# Patient Record
Sex: Female | Born: 1955 | Race: White | Hispanic: No
Health system: Southern US, Community
[De-identification: ages and names within clinical notes are randomized; demographics above are authoritative.]

---

## 2017-09-04 ENCOUNTER — Other Ambulatory Visit: Payer: Self-pay

## 2017-09-04 ENCOUNTER — Emergency Department (HOSPITAL_COMMUNITY): Payer: Self-pay

## 2017-09-04 ENCOUNTER — Inpatient Hospital Stay (HOSPITAL_COMMUNITY)
Admission: EM | Admit: 2017-09-04 | Discharge: 2017-09-26 | DRG: 917 | Disposition: E | Payer: Self-pay | Attending: Pulmonary Disease | Admitting: Pulmonary Disease

## 2017-09-04 ENCOUNTER — Inpatient Hospital Stay (HOSPITAL_COMMUNITY): Payer: Self-pay

## 2017-09-04 DIAGNOSIS — J9601 Acute respiratory failure with hypoxia: Secondary | ICD-10-CM | POA: Diagnosis present

## 2017-09-04 DIAGNOSIS — E872 Acidosis: Secondary | ICD-10-CM | POA: Diagnosis present

## 2017-09-04 DIAGNOSIS — R402312 Coma scale, best motor response, none, at arrival to emergency department: Secondary | ICD-10-CM | POA: Diagnosis present

## 2017-09-04 DIAGNOSIS — J9602 Acute respiratory failure with hypercapnia: Secondary | ICD-10-CM | POA: Diagnosis present

## 2017-09-04 DIAGNOSIS — R402212 Coma scale, best verbal response, none, at arrival to emergency department: Secondary | ICD-10-CM | POA: Diagnosis present

## 2017-09-04 DIAGNOSIS — F329 Major depressive disorder, single episode, unspecified: Secondary | ICD-10-CM | POA: Diagnosis present

## 2017-09-04 DIAGNOSIS — G92 Toxic encephalopathy: Secondary | ICD-10-CM | POA: Diagnosis present

## 2017-09-04 DIAGNOSIS — I959 Hypotension, unspecified: Secondary | ICD-10-CM | POA: Diagnosis present

## 2017-09-04 DIAGNOSIS — I469 Cardiac arrest, cause unspecified: Secondary | ICD-10-CM | POA: Diagnosis present

## 2017-09-04 DIAGNOSIS — Z66 Do not resuscitate: Secondary | ICD-10-CM | POA: Diagnosis not present

## 2017-09-04 DIAGNOSIS — G253 Myoclonus: Secondary | ICD-10-CM

## 2017-09-04 DIAGNOSIS — F1120 Opioid dependence, uncomplicated: Secondary | ICD-10-CM | POA: Diagnosis present

## 2017-09-04 DIAGNOSIS — G931 Anoxic brain damage, not elsewhere classified: Secondary | ICD-10-CM

## 2017-09-04 DIAGNOSIS — T447X1A Poisoning by beta-adrenoreceptor antagonists, accidental (unintentional), initial encounter: Secondary | ICD-10-CM | POA: Diagnosis present

## 2017-09-04 DIAGNOSIS — E875 Hyperkalemia: Secondary | ICD-10-CM | POA: Diagnosis present

## 2017-09-04 DIAGNOSIS — Z515 Encounter for palliative care: Secondary | ICD-10-CM | POA: Diagnosis not present

## 2017-09-04 DIAGNOSIS — R680 Hypothermia, not associated with low environmental temperature: Secondary | ICD-10-CM | POA: Diagnosis present

## 2017-09-04 DIAGNOSIS — I468 Cardiac arrest due to other underlying condition: Secondary | ICD-10-CM | POA: Diagnosis present

## 2017-09-04 DIAGNOSIS — N179 Acute kidney failure, unspecified: Secondary | ICD-10-CM | POA: Diagnosis present

## 2017-09-04 DIAGNOSIS — R739 Hyperglycemia, unspecified: Secondary | ICD-10-CM | POA: Diagnosis present

## 2017-09-04 DIAGNOSIS — T43011A Poisoning by tricyclic antidepressants, accidental (unintentional), initial encounter: Principal | ICD-10-CM | POA: Diagnosis present

## 2017-09-04 DIAGNOSIS — R402112 Coma scale, eyes open, never, at arrival to emergency department: Secondary | ICD-10-CM | POA: Diagnosis present

## 2017-09-04 DIAGNOSIS — Z7951 Long term (current) use of inhaled steroids: Secondary | ICD-10-CM

## 2017-09-04 DIAGNOSIS — J690 Pneumonitis due to inhalation of food and vomit: Secondary | ICD-10-CM | POA: Diagnosis present

## 2017-09-04 LAB — GLUCOSE, CAPILLARY
GLUCOSE-CAPILLARY: 130 mg/dL — AB (ref 65–99)
Glucose-Capillary: 124 mg/dL — ABNORMAL HIGH (ref 65–99)

## 2017-09-04 LAB — I-STAT ARTERIAL BLOOD GAS, ED
ACID-BASE DEFICIT: 12 mmol/L — AB (ref 0.0–2.0)
Acid-base deficit: 16 mmol/L — ABNORMAL HIGH (ref 0.0–2.0)
Bicarbonate: 15.2 mmol/L — ABNORMAL LOW (ref 20.0–28.0)
Bicarbonate: 17.9 mmol/L — ABNORMAL LOW (ref 20.0–28.0)
O2 SAT: 96 %
O2 Saturation: 98 %
PCO2 ART: 60.9 mmHg — AB (ref 32.0–48.0)
PCO2 ART: 54.6 mmHg — AB (ref 32.0–48.0)
PH ART: 7.006 — AB (ref 7.350–7.450)
PH ART: 7.125 — AB (ref 7.350–7.450)
PO2 ART: 171 mmHg — AB (ref 83.0–108.0)
Patient temperature: 98.6
Patient temperature: 98.6
TCO2: 17 mmol/L — AB (ref 22–32)
TCO2: 20 mmol/L — ABNORMAL LOW (ref 22–32)
pO2, Arterial: 110 mmHg — ABNORMAL HIGH (ref 83.0–108.0)

## 2017-09-04 LAB — PROTIME-INR
INR: 1.28
PROTHROMBIN TIME: 15.9 s — AB (ref 11.4–15.2)

## 2017-09-04 LAB — CBC WITH DIFFERENTIAL/PLATELET
BASOS PCT: 0 %
Band Neutrophils: 0 %
Basophils Absolute: 0.2 10*3/uL — ABNORMAL HIGH (ref 0.0–0.1)
Blasts: 0 %
EOS ABS: 0.2 10*3/uL (ref 0.0–0.7)
Eosinophils Relative: 2 %
HCT: 41.5 % (ref 36.0–46.0)
HEMOGLOBIN: 12.9 g/dL (ref 12.0–15.0)
LYMPHS PCT: 30 %
Lymphs Abs: 8.9 10*3/uL — ABNORMAL HIGH (ref 0.7–4.0)
MCH: 30.6 pg (ref 26.0–34.0)
MCHC: 31.1 g/dL (ref 30.0–36.0)
MCV: 98.6 fL (ref 78.0–100.0)
MONO ABS: 1.9 10*3/uL — AB (ref 0.1–1.0)
MONOS PCT: 7 %
MYELOCYTES: 0 %
Metamyelocytes Relative: 0 %
NEUTROS PCT: 61 %
NRBC: 0 /100{WBCs}
Neutro Abs: 22 10*3/uL — ABNORMAL HIGH (ref 1.7–7.7)
OTHER: 0 %
PLATELETS: 202 10*3/uL (ref 150–400)
PROMYELOCYTES ABS: 0 %
RBC: 4.21 MIL/uL (ref 3.87–5.11)
RDW: 14.7 % (ref 11.5–15.5)
WBC: 33.1 10*3/uL — AB (ref 4.0–10.5)

## 2017-09-04 LAB — BASIC METABOLIC PANEL
ANION GAP: 8 (ref 5–15)
BUN: 23 mg/dL — ABNORMAL HIGH (ref 6–20)
CALCIUM: 7.3 mg/dL — AB (ref 8.9–10.3)
CO2: 19 mmol/L — ABNORMAL LOW (ref 22–32)
Chloride: 114 mmol/L — ABNORMAL HIGH (ref 101–111)
Creatinine, Ser: 1.28 mg/dL — ABNORMAL HIGH (ref 0.44–1.00)
GFR calc non Af Amer: 45 mL/min — ABNORMAL LOW (ref >60)
GFR, EST AFRICAN AMERICAN: 52 mL/min — AB (ref >60)
GLUCOSE: 149 mg/dL — AB (ref 65–99)
Potassium: 5 mmol/L (ref 3.5–5.1)
SODIUM: 141 mmol/L (ref 135–145)

## 2017-09-04 LAB — COMPREHENSIVE METABOLIC PANEL
ALK PHOS: 124 U/L (ref 38–126)
ALT: 49 U/L (ref 14–54)
AST: 71 U/L — AB (ref 15–41)
Albumin: 2.7 g/dL — ABNORMAL LOW (ref 3.5–5.0)
Anion gap: 17 — ABNORMAL HIGH (ref 5–15)
BUN: 24 mg/dL — AB (ref 6–20)
CALCIUM: 8.7 mg/dL — AB (ref 8.9–10.3)
CHLORIDE: 108 mmol/L (ref 101–111)
CO2: 13 mmol/L — AB (ref 22–32)
CREATININE: 1.34 mg/dL — AB (ref 0.44–1.00)
GFR calc Af Amer: 49 mL/min — ABNORMAL LOW (ref >60)
GFR calc non Af Amer: 42 mL/min — ABNORMAL LOW (ref >60)
GLUCOSE: 235 mg/dL — AB (ref 65–99)
Potassium: 5.7 mmol/L — ABNORMAL HIGH (ref 3.5–5.1)
SODIUM: 138 mmol/L (ref 135–145)
Total Bilirubin: 0.8 mg/dL (ref 0.3–1.2)
Total Protein: 5.3 g/dL — ABNORMAL LOW (ref 6.5–8.1)

## 2017-09-04 LAB — LACTIC ACID, PLASMA
LACTIC ACID, VENOUS: 2.6 mmol/L — AB (ref 0.5–1.9)
LACTIC ACID, VENOUS: 3.1 mmol/L — AB (ref 0.5–1.9)

## 2017-09-04 LAB — INFLUENZA PANEL BY PCR (TYPE A & B)
INFLBPCR: NEGATIVE
Influenza A By PCR: NEGATIVE

## 2017-09-04 LAB — RAPID URINE DRUG SCREEN, HOSP PERFORMED
Amphetamines: NOT DETECTED
BARBITURATES: NOT DETECTED
Benzodiazepines: POSITIVE — AB
Cocaine: POSITIVE — AB
OPIATES: POSITIVE — AB
TETRAHYDROCANNABINOL: NOT DETECTED

## 2017-09-04 LAB — I-STAT TROPONIN, ED: Troponin i, poc: 0.05 ng/mL (ref 0.00–0.08)

## 2017-09-04 LAB — I-STAT CHEM 8, ED
BUN: 29 mg/dL — AB (ref 6–20)
CALCIUM ION: 1.09 mmol/L — AB (ref 1.15–1.40)
CREATININE: 1.2 mg/dL — AB (ref 0.44–1.00)
Chloride: 112 mmol/L — ABNORMAL HIGH (ref 101–111)
GLUCOSE: 238 mg/dL — AB (ref 65–99)
HCT: 43 % (ref 36.0–46.0)
HEMOGLOBIN: 14.6 g/dL (ref 12.0–15.0)
Potassium: 5.2 mmol/L — ABNORMAL HIGH (ref 3.5–5.1)
Sodium: 138 mmol/L (ref 135–145)
TCO2: 16 mmol/L — AB (ref 22–32)

## 2017-09-04 LAB — I-STAT CG4 LACTIC ACID, ED: Lactic Acid, Venous: 9.76 mmol/L (ref 0.5–1.9)

## 2017-09-04 LAB — PHOSPHORUS: Phosphorus: 6.9 mg/dL — ABNORMAL HIGH (ref 2.5–4.6)

## 2017-09-04 LAB — SALICYLATE LEVEL

## 2017-09-04 LAB — ACETAMINOPHEN LEVEL

## 2017-09-04 LAB — PROCALCITONIN: Procalcitonin: <0.1 ng/mL

## 2017-09-04 LAB — ETHANOL: Alcohol, Ethyl (B): <10 mg/dL (ref <10)

## 2017-09-04 LAB — TROPONIN I: TROPONIN I: 0.14 ng/mL — AB (ref <0.03)

## 2017-09-04 LAB — MAGNESIUM: Magnesium: 1.8 mg/dL (ref 1.7–2.4)

## 2017-09-04 LAB — CBG MONITORING, ED: GLUCOSE-CAPILLARY: 174 mg/dL — AB (ref 65–99)

## 2017-09-04 MED ORDER — INSULIN ASPART 100 UNIT/ML ~~LOC~~ SOLN
1.0000 [IU] | SUBCUTANEOUS | Status: DC
  Administered 2017-09-04 – 2017-09-07 (×12): 1 [IU] via SUBCUTANEOUS

## 2017-09-04 MED ORDER — ALBUTEROL SULFATE (2.5 MG/3ML) 0.083% IN NEBU: 2.5000 mg | INHALATION_SOLUTION | Freq: Four times a day (QID) | RESPIRATORY_TRACT | Status: DC | PRN

## 2017-09-04 MED ORDER — PIPERACILLIN-TAZOBACTAM 3.375 G IVPB 30 MIN: 3.3750 g | Freq: Once | INTRAVENOUS | Status: DC

## 2017-09-04 MED ORDER — PHENYLEPHRINE HCL 10 MG/ML IJ SOLN
0.0000 ug/min | Freq: Once | INTRAVENOUS | Status: DC
  Filled 2017-09-04: qty 1

## 2017-09-04 MED ORDER — SODIUM CHLORIDE 0.9 % IV SOLN: 250.0000 mL | INTRAVENOUS | Status: DC | PRN

## 2017-09-04 MED ORDER — FENTANYL CITRATE (PF) 100 MCG/2ML IJ SOLN
50.0000 ug | Freq: Once | INTRAMUSCULAR | Status: DC
  Filled 2017-09-04: qty 2

## 2017-09-04 MED ORDER — SODIUM BICARBONATE 8.4 % IV SOLN
50.0000 meq | Freq: Once | INTRAVENOUS | Status: AC
  Administered 2017-09-04: 50 meq via INTRAVENOUS

## 2017-09-04 MED ORDER — VANCOMYCIN HCL IN DEXTROSE 750-5 MG/150ML-% IV SOLN
750.0000 mg | Freq: Two times a day (BID) | INTRAVENOUS | Status: DC
  Administered 2017-09-05: 750 mg via INTRAVENOUS
  Filled 2017-09-04: qty 150

## 2017-09-04 MED ORDER — PANTOPRAZOLE SODIUM 40 MG IV SOLR
40.0000 mg | Freq: Two times a day (BID) | INTRAVENOUS | Status: DC
  Administered 2017-09-04 – 2017-09-08 (×8): 40 mg via INTRAVENOUS
  Filled 2017-09-04 (×8): qty 40

## 2017-09-04 MED ORDER — VANCOMYCIN HCL 10 G IV SOLR
2000.0000 mg | Freq: Once | INTRAVENOUS | Status: AC
  Administered 2017-09-04: 2000 mg via INTRAVENOUS
  Filled 2017-09-04: qty 2000

## 2017-09-04 MED ORDER — MIDAZOLAM HCL 2 MG/2ML IJ SOLN
2.0000 mg | Freq: Once | INTRAMUSCULAR | Status: AC
  Administered 2017-09-04: 2 mg via INTRAVENOUS
  Filled 2017-09-04: qty 2

## 2017-09-04 MED ORDER — SODIUM CHLORIDE 0.9 % IV BOLUS (SEPSIS)
1000.0000 mL | Freq: Once | INTRAVENOUS | Status: AC
  Administered 2017-09-04: 1000 mL via INTRAVENOUS

## 2017-09-04 MED ORDER — SODIUM BICARBONATE 8.4 % IV SOLN
INTRAVENOUS | Status: DC
  Administered 2017-09-04: 21:00:00 via INTRAVENOUS
  Filled 2017-09-04 (×2): qty 850

## 2017-09-04 MED ORDER — HEPARIN SODIUM (PORCINE) 5000 UNIT/ML IJ SOLN
5000.0000 [IU] | Freq: Three times a day (TID) | INTRAMUSCULAR | Status: DC
  Administered 2017-09-04 – 2017-09-08 (×11): 5000 [IU] via SUBCUTANEOUS
  Filled 2017-09-04 (×12): qty 1

## 2017-09-04 MED ORDER — PIPERACILLIN-TAZOBACTAM 3.375 G IVPB
3.3750 g | Freq: Three times a day (TID) | INTRAVENOUS | Status: DC
  Administered 2017-09-05: 3.375 g via INTRAVENOUS
  Filled 2017-09-04 (×2): qty 50

## 2017-09-04 MED ORDER — IPRATROPIUM-ALBUTEROL 0.5-2.5 (3) MG/3ML IN SOLN
3.0000 mL | RESPIRATORY_TRACT | Status: DC
  Administered 2017-09-04 – 2017-09-08 (×23): 3 mL via RESPIRATORY_TRACT
  Filled 2017-09-04 (×22): qty 3

## 2017-09-04 NOTE — Progress Notes (Signed)
   2018-07-09 1500  Clinical Encounter Type  Visited With Health care provider  Visit Type ED  Referral From Nurse  Consult/Referral To Chaplain   Responded to a Code Stemi for this patient.  CPR was in place.  Patient is from out of state visiting a friend, according to EMS.  Unsure if friend will come.  No next of kin contact at this point.  Medical team assessing the patient.  Will follow as needed. Chaplain Agustin CreeNewton Jerome Viglione

## 2017-09-04 NOTE — Progress Notes (Signed)
Critical ABG values reported to Dr. Ardeth PerfectJeong.

## 2017-09-04 NOTE — ED Triage Notes (Addendum)
Pt arrives by [facility] for post cardiac arrest, per report she was found to be less responsive around 1315 and ems contacted family provided breaths for patient with no cpr until EMS arrival at 1345 where pt was found to be in asystole, 1 mg narcan 4 mg epi, no shocks and pt re gained pulses PTA at 1413.  Pt arrives to Trauma C met by EPD and cardiologist with airway in place. Heart rate 130. bp 130/73

## 2017-09-04 NOTE — Progress Notes (Signed)
Critical ABG values reported to Dr. Jeong.  

## 2017-09-04 NOTE — ED Notes (Signed)
X RAY at bedside 

## 2017-09-04 NOTE — ED Provider Notes (Signed)
  Physical Exam  BP (!) 79/66   Pulse 70   Resp 18   Ht 5\' 5"  (1.651 m)   SpO2 100%   Physical Exam  ED Course/Procedures     Procedures  MDM   PT comes in post cardiac arrest. Pt is from outside town, and residing with a friend. Pt was doing well when she arrived to the friend's home. The friend noted that pt was noted to have dib and turned blue and then became unresponsive. Pt was noted to be in CPR by EMS, and after several rounds of CPR and epi we had ROSC. Pt didn't have Vfib.  BP is low, but MAP > 65. Labs pending. Cards doesn't think this is MI - no need for cath.  Pt had received epi and narcan prior to ER arrival. There is hx of opiate abuse, but bystanders reported that pt was not using any drugs as of late.  CCM to admit.      Derwood KaplanNanavati, Senan Urey, MD 09/24/2017 1540

## 2017-09-04 NOTE — ED Provider Notes (Signed)
[facility] EMERGENCY DEPARTMENT Provider Note   CSN: [id number] Arrival date & time: 09/09/17  1440     History   Chief Complaint Chief Complaint  Patient presents with  . Cardiac Arrest    HPI Ashleen Demma is a 62 y.o. female.  Patient is a 62 year old female with little known medical history as she is here from out of town staying with some friends who reported that she was normal this morning and around 1:00 this afternoon they were helping her move from a couch to the bedroom when she looked like she was having some difficulty breathing.  Shortly afterwards she became blue and stopped breathing.  They gave mouth-to-mouth and when fire arrived CPR was initiated.  When paramedics arrived patient was in asystole and then went into PEA.  She required 30 minutes of CPR, 4 rounds of epi and a dose of Narcan with return of spontaneous circulation.  Per witnesses patient had a prior history of opiate addiction but was not known to be using currently.  Patient does take benzos but EMS stated there was an appropriate amount in the bottle.  Patient does take gabapentin but unknown if there is any cardiac history.  Unknown if patient is a smoker   The history is provided by the EMS personnel. The history is limited by the condition of the patient.  Cardiac Arrest  Witnessed by:  Friend Incident location:  Home Time before BLS initiated:  3-5 minutes Time before ALS initiated:  8-10 minutes Condition upon EMS arrival:  Agonal respirations Pulse:  Absent Initial cardiac rhythm per EMS:  Asystole Treatments prior to arrival:  ACLS protocol Medications given prior to ED:  Epinephrine Airway:  Bag valve mask and intubation prior to arrival Rhythm on admission to ED:  Sinus tachycardia   No past medical history on file.  There are no active problems to display for this patient.     OB History    No data available       Home Medications    Prior to Admission  medications   Not on File    Family History No family history on file.  Social History Social History   Tobacco Use  . Smoking status: Not on file  Substance Use Topics  . Alcohol use: Not on file  . Drug use: Not on file     Allergies   Patient has no allergy information on record.   Review of Systems Review of Systems  Unable to perform ROS: Acuity of condition     Physical Exam Updated Vital Signs BP 91/67   Pulse 73   Resp 15   Ht 5\' 5"  (1.651 m)   SpO2 100%   Physical Exam  Constitutional: She appears well-developed and well-nourished.  Patient is ill-appearing, intubated and unresponsive  HENT:  Head: Normocephalic and atraumatic.  Mouth/Throat: Oropharynx is clear and moist.  Eyes: Conjunctivae and EOM are normal. Pupils are equal, round, and reactive to light.  Neck: Normal range of motion. Neck supple.  Cardiovascular: Regular rhythm. Tachycardia present.  No murmur heard. Femoral pulses are palpated  Pulmonary/Chest: She has no wheezes. She has rhonchi. She has no rales.  Intubated with a 7.0 tube breath sounds are equal bilaterally but rhonchorous diffusely  Abdominal: Soft. She exhibits no distension. There is no tenderness. There is no rebound and no guarding.  Musculoskeletal: Normal range of motion. She exhibits no edema or tenderness.  Neurological:  GCS of 3.  No  purposeful movements.  Pupils are 4 mm and minimally reactive  Skin: Skin is warm and dry. Capillary refill takes more than 3 seconds. No rash noted. No erythema.  Dusky toes and fingers  Nursing note and vitals reviewed.    ED Treatments / Results  Labs (all labs ordered are listed, but only abnormal results are displayed) Labs Reviewed  I-STAT CHEM 8, ED - Abnormal; Notable for the following components:      Result Value   Potassium 5.2 (*)    Chloride 112 (*)    BUN 29 (*)    Creatinine, Ser 1.20 (*)    Glucose, Bld 238 (*)    Calcium, Ion 1.09 (*)    TCO2 16 (*)     All other components within normal limits  I-STAT CG4 LACTIC ACID, ED - Abnormal; Notable for the following components:   Lactic Acid, Venous 9.76 (*)    All other components within normal limits  CBC WITH DIFFERENTIAL/PLATELET  COMPREHENSIVE METABOLIC PANEL  RAPID URINE DRUG SCREEN, HOSP PERFORMED  ETHANOL  ACETAMINOPHEN LEVEL  SALICYLATE LEVEL  I-STAT TROPONIN, ED  I-STAT ARTERIAL BLOOD GAS, ED    EKG ED ECG REPORT   Date: 09/25/2017  Rate: 69  Rhythm: normal sinus rhythm and premature ventricular contractions (PVC)  QRS Axis: normal  Intervals: PR prolonged  ST/T Wave abnormalities: nonspecific ST/T changes  Conduction Disutrbances:nonspecific intraventricular conduction delay  Narrative Interpretation:   Old EKG Reviewed: none available  I have personally reviewed the EKG tracing and agree with the computerized printout as noted.   Radiology Dg Chest Port 1 View  Result Date: 09/12/2017 CLINICAL DATA:  Status post cardiac arrest.  Hypoxia. EXAM: PORTABLE CHEST 1 VIEW COMPARISON:  None. FINDINGS: Endotracheal tube tip is 5.0 cm above the carina. Nasogastric tube tip and side port are below the diaphragm. No pneumothorax. There is apparent fibrosis throughout the lungs bilaterally. There may be superimposed interstitial edema. There is no consolidation. The heart size is normal. Pulmonary vascularity appears grossly normal on this supine portable chest examination. No adenopathy appreciable. No bone lesions. IMPRESSION: Tube positions as described without evident pneumothorax. Probable pulmonary fibrosis with questionable superimposed interstitial edema. No consolidation. Heart size normal. Electronically Signed   By: Bretta Bang III M.D.   On: 09/22/2017 15:10    Procedures Procedures (including critical care time)  Medications Ordered in ED Medications  sodium chloride 0.9 % bolus 1,000 mL (1,000 mLs Intravenous New Bag/Given 09/05/2017 1453)  fentaNYL (SUBLIMAZE)  injection 50 mcg (not administered)  midazolam (VERSED) injection 2 mg (not administered)     Initial Impression / Assessment and Plan / ED Course  I have reviewed the triage vital signs and the nursing notes.  Pertinent labs & imaging results that were available during my care of the patient were reviewed by me and considered in my medical decision making (see chart for details).     Patient presenting as a postarrest however concern for possible respiratory arrest.  Unclear if patient is currently abusing drugs but appeared to be a witnessed arrest.  Patient received 30 minutes of CPR, 4 rounds of epi and Narcan.  Patient is showing minimal signs of life.  She is breathing over the tube but is not reacting to pain and has not had sedation.  Initially this was called a code STEMI however upon arrival here patient does not have classic ST elevation concerning for MI.  Concern for possible overdose versus PE.  Lower suspicion for dissection.  Also concern for cardiac cause.  Chest x-ray, CBC, CMP, troponin, ABG, lactate, UDS, EtOH, acetaminophen, salicylate is pending.  Patient is maintaining blood pressures in the high 80s and low 90s with maps in the 70s.  We will continue fluid bolus at this time.  Patient's chest x-ray shows appropriate tube placement with some possible mild overlying edema but no evidence of pneumothorax or pneumonia.  Lactate is 9.7 with a potassium of 5.2 and a creatinine of 1.2.  Troponin is within normal limits.  Rest of labs are still pending.  Discussed with critical care who will come and consult on the patient.  Vision is not currently a candidate for cooling as she had a prolonged downtime initial rhythm of asystole and does not meet criteria.  She currently has received no sedation and has minimal signs of life  CRITICAL CARE Performed by: Chas Axel Total critical care time: 30 minutes Critical care time was exclusive of separately billable procedures and  treating other patients. Critical care was necessary to treat or prevent imminent or life-threatening deterioration. Critical care was time spent personally by me on the following activities: development of treatment plan with patient and/or surrogate as well as nursing, discussions with consultants, evaluation of patient's response to treatment, examination of patient, obtaining history from patient or surrogate, ordering and performing treatments and interventions, ordering and review of laboratory studies, ordering and review of radiographic studies, pulse oximetry and re-evaluation of patient's condition.   Final Clinical Impressions(s) / ED Diagnoses   Final diagnoses:  Cardiac arrest [facility](HCC)    ED Discharge Orders    None       Gwyneth SproutPlunkett, Cathyrn Deas, MD November 15, 2017 1558

## 2017-09-04 NOTE — H&P (Signed)
PULMONARY / CRITICAL CARE MEDICINE   Name: Sheila Hogan MRN: [id number] DOB: [birth year]    ADMISSION DATE:  09/13/2017 CONSULTATION DATE:  09/23/2017  REFERRING MD:  Dr. Kathrynn Humble  CHIEF COMPLAINT:  Cardiac arrest  HISTORY OF PRESENT ILLNESS:   HPI obtained medical chart review as patient is acutely encephalopathic and intubated.   62 year old female with little known prior medical history other than opioid abuse who presented to the ER on 1/10 after cardiac arrest.  Reportedly patient lives in [place] but is here visiting a friend.  No family or friends present at hospital.  Friend over phone reported patient has been depressed recently, and taking Tums for unknown GI issues.  Unknown recent opioid or elicit drug use.  RX bottles present with patient for Neurontin which are accounted for and xanax.    Apparently witnessed by friend today to go unresponsive, turned blue and mouth-to-mouth was giving but no bystander CPR.  EMS found patient in asystole at 1345.  Narcan and 4 rounds of epi with CPR no defibrillation given with ROSC at 1413.  Code STEMI activated EMS but canceled on arrival to ER by EDP and cardiology after review of EKG.   Patient found to be breathing over the vent and unresponsive.  No sedation or paralytics given.  Noted to be hypothermic at 32.5 Celsius, hypotensive currently receiving second liter normal saline bolus, troponin 0.5, sCr 1.34, K 5.7, CO2 13, lactic acid 9.76, other labs pending, UDS pending.  PCCM to admit.  PAST MEDICAL HISTORY :  She  has no past medical history on file.  PAST SURGICAL HISTORY: She  has no past surgical history on file.  No Known Allergies  No current facility-administered medications on file prior to encounter.    Current Outpatient Medications on File Prior to Encounter  Medication Sig  . alprazolam (XANAX) 2 MG tablet Take 2 mg by mouth at bedtime as needed for sleep.  Marland Kitchen amitriptyline (ELAVIL) 10 MG tablet Take 10 mg by mouth at  bedtime.  . calcium carbonate (TUMS - DOSED IN MG ELEMENTAL CALCIUM) 500 MG chewable tablet Chew 1 tablet by mouth every 3 (three) hours as needed for indigestion or heartburn.  . cyclobenzaprine (FLEXERIL) 10 MG tablet Take 10 mg by mouth 3 (three) times daily as needed for muscle spasms.  . Fluticasone-Salmeterol (ADVAIR DISKUS) 500-50 MCG/DOSE AEPB Inhale 1 puff into the lungs 2 (two) times daily.  Marland Kitchen gabapentin (NEURONTIN) 300 MG capsule Take 600 mg by mouth 3 (three) times daily.  Marland Kitchen linaclotide (LINZESS) 145 MCG CAPS capsule Take 145 mcg by mouth every other day.  . pantoprazole (PROTONIX) 40 MG tablet Take 40 mg by mouth daily.  . ranitidine (ZANTAC) 150 MG tablet Take 150 mg by mouth 2 (two) times daily.  . rizatriptan (MAXALT-MLT) 10 MG disintegrating tablet Take 10 mg by mouth once as needed for migraine. May repeat in 2 hours if needed    FAMILY HISTORY:  Her has no family status information on file.    SOCIAL HISTORY: Unknown  REVIEW OF SYSTEMS:   Unable  SUBJECTIVE:   VITAL SIGNS: BP (!) 83/61   Pulse 63   Resp 14   Ht 5' 5"  (1.651 m)   SpO2 100%   HEMODYNAMICS:    VENTILATOR SETTINGS: Vent Mode: PRVC FiO2 (%):  [100 %] 100 % Set Rate:  [14 bmp] 14 bmp Vt Set:  [500 mL] 500 mL PEEP:  [5 cmH20] 5 cmH20 Plateau Pressure:  [16 cmH20-20 cmH20]  20 cmH20  INTAKE / OUTPUT: No intake/output data recorded.  PHYSICAL EXAMINATION: General:  Critically ill appearing healthy adult WF unresponsive on MV HEENT: [place]/NT, pupils 4/minimally reactive, scleral anicteric, absent corneal reflex bilaterally, ETT/OGT, teeth clenched with tongue laceration- scant bleeding, left nasal ring Neuro: unresponsive- no prior sedation or paralytics given CV: RR, weak peripheral pulses, distant heart sounds PULM: even/non-labored on MV, breathing above MV, lungs bilaterally rhonchi with exp wheeze throughout GI: obese, soft, bs hypoactive  Extremities: cool/dry, trace BLE edema, left  tibial IO Skin: no rash, abrasion to left knee, multiple tattoos, no obvious track marks   LABS:  BMET Recent Labs  Lab 09/03/2017 1455 08/27/2017 1522  NA 138 138  K 5.7* 5.2*  CL 108 112*  CO2 13*  --   BUN 24* 29*  CREATININE 1.34* 1.20*  GLUCOSE 235* 238*    Electrolytes Recent Labs  Lab 09/24/2017 1455  CALCIUM 8.7*    CBC Recent Labs  Lab 09/12/2017 1455 08/27/2017 1522  WBC 33.1*  --   HGB 12.9 14.6  HCT 41.5 43.0  PLT 202  --     Coag's No results for input(s): APTT, INR in the last 168 hours.  Sepsis Markers Recent Labs  Lab 08/26/2017 1522  LATICACIDVEN 9.76*    ABG Recent Labs  Lab 08/29/2017 1629  PHART 7.006*  PCO2ART 60.9*  PO2ART 171.0*    Liver Enzymes Recent Labs  Lab 09/03/2017 1455  AST 71*  ALT 49  ALKPHOS 124  BILITOT 0.8  ALBUMIN 2.7*    Cardiac Enzymes No results for input(s): TROPONINI, PROBNP in the last 168 hours.  Glucose No results for input(s): GLUCAP in the last 168 hours.  Imaging Dg Chest Port 1 View  Result Date: 09/24/2017 CLINICAL DATA:  Status post cardiac arrest.  Hypoxia. EXAM: PORTABLE CHEST 1 VIEW COMPARISON:  None. FINDINGS: Endotracheal tube tip is 5.0 cm above the carina. Nasogastric tube tip and side port are below the diaphragm. No pneumothorax. There is apparent fibrosis throughout the lungs bilaterally. There may be superimposed interstitial edema. There is no consolidation. The heart size is normal. Pulmonary vascularity appears grossly normal on this supine portable chest examination. No adenopathy appreciable. No bone lesions. IMPRESSION: Tube positions as described without evident pneumothorax. Probable pulmonary fibrosis with questionable superimposed interstitial edema. No consolidation. Heart size normal. Electronically Signed   By: Lowella Grip III M.D.   On: 08/28/2017 15:10   STUDIES:  1/10 Head CT >>   EEG >> TTE >>  CULTURES: 1/10 BC x 2 >> 1/10 Trach aspirate  >>  ANTIBIOTICS: none  SIGNIFICANT EVENTS: 1/10 Admit  LINES/TUBES: PIV x 2 Left tibial IO 1/10 ETT >> 1/10 OGT >> 1/10 foley >>  DISCUSSION: 82 yoF with little known PMH other than prior opioid abuse. Medication bottles filled in [place], but has [employer]. Practically full xanax 93m bottle filled in 03/2017 with other mixed in pills unknown).  Patient here visiting a friend with witnessed unresponsiveness 1/10, no bystander CPR[place], after 4 rounds epi and Narcan 232m  Hypotensive, hypothermic (no environmental exposure known, ? If witnessed unresponsive why so hypothermic), unresponsive in ER. Unclear etiology of what precipitated  Events.   ASSESSMENT / PLAN:  PULMONARY A: Acute hypoxic/ hypercapnic respiratory failure  R/o aspiration pneumonitis vs pna vs viral  Will need to rule out PE given sudden unresponsiveness/ cardiac arrest/ low Pa/FiO2 ratio  P:   Full MV support,  PRVC Rate adjusted for resp/ met acidosis ABG in one hour Duonebs q4 prn VAP See ID CTA if sCr allows  CARDIOVASCULAR A:  Asystolic Cardiac Arrest- likely precipitated by respiratory arrest given UDS vs acute PE Cocaine positive  Shock - unclear septic +/- cardiogenic Prolonged QTc and wide QRS - known ~28 min down time, found in asystole, no bystander CPR - unknown etiology, suspected related to drug abuse w/hx of opioid abuse - presented hypotensive, hypothermic 32.5 - ruled out as STEMI in ER; Cards did not think MI per EDP notes - troponin 0.05, EKG  Concern for TCA and possible propanolol overdose given wide QRS and hx depression P:  Tele receiving IV NS bolus to make 3L then neo for map > 65 if needed No beta blockers given cocaine use Trend lactate Trend Troponin and EKG now then in AM if no apparent change  Correct electrolyte abnormalities/ acidosis will help stabilize BP  TTE  Avoid QTc prolonging meds - TCA/ propanolol OD would be  treated with HCO3 till QRS closes   RENAL A:   AKI - unknown baseline Hyperkalemia AGMA/ lactic acidosis  P:   Finishing 3rd L NS now Bicarb 1 amp now Bmet q 4 x 2  Waiting on mag/ phos Trend BMP / urinary output/ daily weights Replace electrolytes as indicated  GASTROINTESTINAL A:   ? Hx of GI issues- reportedly taking Tums, pepcid P:   Trend LFTs NPO PPI for SUP  HEMATOLOGIC A:   Leukocytosis  P:  Trend CBC Heparin SQ and SCDs  HIV pending  INFECTIOUS A:   Leukocytosis - can not rule out infectious etiology vs reactive- given hypothermia (without known environmental exposure)/ hypotension/ AMS/ elicit drug abuse P:   BC x 2, trach aspirate, UA Assess PCT, Flu Trend WBC / fever curve Empiric Vanc and Zosyn for now given unknown history  Droplet precautions  ENDOCRINE A:   Hyperglycemia  P:   CBG q 4 SSI   NEUROLOGIC A:   Acute encephalopathy related to toxic/ metabolic/ and concerning for severe anoxic injury given GCS 3, absent gag, cough, and corneal reflexes - positive for opioid, cocaine, and benzo - reportedly friend concerned over recent depression, can not rule out SI Hx of opioid abuse, takes Neurontin and xanax Can not rule out TCA overdose given wide QRS - pharmacy had rx list faxed from [place]- noted to be taking xanax, amitriptyline, flexeril, gabapentin, linzess, inderal, maxalt P:   Frequent neuro exams Pending ETOH, acetaminophen, and salicylate level Repeat EKG now EEG rule out subclinical status Seizure precautions Head CT when BP stabilized Versed x 1 for clenching, when BP improved, can use propofol if needed  If encephalopathy not explained, consider further testing of LP, neuro consult, etc See ID  Consider Psych consult if neurologically recovers   FAMILY  - Updates:  No family known; patient is visiting from [place].  Rosemary Holms - "not a blood daughter" [phone], friend Jamesetta So [phone]  -  Inter-disciplinary family meet or Palliative Care meeting due by:  09/11/2017  CCT 60 mins  Kennieth Rad, [facility] Pgr: [phone] or if no answer [phone] 09/12/2017, 4:44 PM

## 2017-09-04 NOTE — Progress Notes (Signed)
Pt arrived to 25M approximately 1830. CHG performed upon admission. Pt placed on monitor. Hypertensive, NSR 60BPM. EKG performed and handed to ICU night MD. Temp 32C. CCM aware.  Critical values received of troponin 0.14 and lactic acid 2.6, verbally notified Brooke NP. No new orders given.   [facility]. Bedside assessment completed.

## 2017-09-04 NOTE — Progress Notes (Signed)
Pharmacy Antibiotic Note  Sheila LundRebecca Hogan is a 62 y.o. female admitted on 28-Nov-2017 with sepsis.  Pharmacy has been consulted for vancomycin and Zosyn dosing. Pt is afebrile. Elevated Lactate at 2.6, Elevated WBC at 33.1, Scr 1.2.  Plan: Vancomycin 2gm IV x1 Vancomycin 750mg  q12h. Goal trough 15-20 mcg/mL Zosyn 3.375g IV x1 Zosyn 3.375g IV q8h (4 hour infusion) F/u renal fxn, trough @ SS, clinical resolution  Height: 5\' 5"  (165.1 cm) Weight: 183 lb 10.3 oz (83.3 kg) IBW/kg (Calculated) : 57  No data recorded.  Recent Labs  Lab 01/09/18 1455 01/09/18 1522 01/09/18 1706  WBC 33.1*  --   --   CREATININE 1.34* 1.20*  --   LATICACIDVEN  --  9.76* 2.6*    Estimated Creatinine Clearance: 53.8 mL/min (A) (by C-G formula based on SCr of 1.2 mg/dL (H)).    No Known Allergies  Antimicrobials this admission: Vanc 1/10 >>  Zosyn 1/10 >>   Microbiology results: Pending  Thank you for allowing pharmacy to be a part of this patient's care.  Sheila Hogan 28-Nov-2017 7:00 PM

## 2017-09-04 NOTE — Progress Notes (Signed)
Critical value of lactic acid 3.1 given to Night resident Alinda MoneyMelvin MD.   Pt hypertensive BP 150/105. MD aware. No new orders given

## 2017-09-05 ENCOUNTER — Inpatient Hospital Stay (HOSPITAL_COMMUNITY): Payer: Self-pay

## 2017-09-05 DIAGNOSIS — I469 Cardiac arrest, cause unspecified: Secondary | ICD-10-CM

## 2017-09-05 DIAGNOSIS — G931 Anoxic brain damage, not elsewhere classified: Secondary | ICD-10-CM

## 2017-09-05 DIAGNOSIS — G253 Myoclonus: Secondary | ICD-10-CM

## 2017-09-05 LAB — GLUCOSE, CAPILLARY
GLUCOSE-CAPILLARY: 133 mg/dL — AB (ref 65–99)
GLUCOSE-CAPILLARY: 134 mg/dL — AB (ref 65–99)
Glucose-Capillary: 112 mg/dL — ABNORMAL HIGH (ref 65–99)
Glucose-Capillary: 126 mg/dL — ABNORMAL HIGH (ref 65–99)
Glucose-Capillary: 133 mg/dL — ABNORMAL HIGH (ref 65–99)
Glucose-Capillary: 140 mg/dL — ABNORMAL HIGH (ref 65–99)

## 2017-09-05 LAB — ECHOCARDIOGRAM COMPLETE
Height: 65 in
WEIGHTICAEL: 2938.29 [oz_av]

## 2017-09-05 LAB — BLOOD GAS, ARTERIAL
ACID-BASE DEFICIT: 8.9 mmol/L — AB (ref 0.0–2.0)
Bicarbonate: 15.6 mmol/L — ABNORMAL LOW (ref 20.0–28.0)
DRAWN BY: 517021
FIO2: 1
MECHVT: 550 mL
O2 Saturation: 98.2 %
PEEP/CPAP: 5 cmH2O
PO2 ART: 103 mmHg (ref 83.0–108.0)
Patient temperature: 89.6
RATE: 24 resp/min
pCO2 arterial: 22.5 mmHg — ABNORMAL LOW (ref 32.0–48.0)
pH, Arterial: 7.426 (ref 7.350–7.450)

## 2017-09-05 LAB — COMPREHENSIVE METABOLIC PANEL
ALBUMIN: 2.5 g/dL — AB (ref 3.5–5.0)
ALT: 78 U/L — AB (ref 14–54)
AST: 201 U/L — AB (ref 15–41)
Alkaline Phosphatase: 93 U/L (ref 38–126)
Anion gap: 14 (ref 5–15)
BUN: 30 mg/dL — AB (ref 6–20)
CHLORIDE: 108 mmol/L (ref 101–111)
CO2: 18 mmol/L — ABNORMAL LOW (ref 22–32)
CREATININE: 2.05 mg/dL — AB (ref 0.44–1.00)
Calcium: 6.9 mg/dL — ABNORMAL LOW (ref 8.9–10.3)
GFR calc Af Amer: 29 mL/min — ABNORMAL LOW (ref >60)
GFR, EST NON AFRICAN AMERICAN: 25 mL/min — AB (ref >60)
GLUCOSE: 131 mg/dL — AB (ref 65–99)
POTASSIUM: 4.1 mmol/L (ref 3.5–5.1)
SODIUM: 140 mmol/L (ref 135–145)
Total Bilirubin: 1.6 mg/dL — ABNORMAL HIGH (ref 0.3–1.2)
Total Protein: 5 g/dL — ABNORMAL LOW (ref 6.5–8.1)

## 2017-09-05 LAB — CBC
HCT: 45 % (ref 36.0–46.0)
Hemoglobin: 14.4 g/dL (ref 12.0–15.0)
MCH: 29.4 pg (ref 26.0–34.0)
MCHC: 32 g/dL (ref 30.0–36.0)
MCV: 92 fL (ref 78.0–100.0)
PLATELETS: 105 10*3/uL — AB (ref 150–400)
RBC: 4.89 MIL/uL (ref 3.87–5.11)
RDW: 14.3 % (ref 11.5–15.5)
WBC: 24.2 10*3/uL — ABNORMAL HIGH (ref 4.0–10.5)

## 2017-09-05 LAB — HIV ANTIBODY (ROUTINE TESTING W REFLEX): HIV SCREEN 4TH GENERATION: NONREACTIVE

## 2017-09-05 LAB — TROPONIN I
TROPONIN I: 0.38 ng/mL — AB (ref <0.03)
TROPONIN I: 0.41 ng/mL — AB (ref <0.03)
TROPONIN I: 0.98 ng/mL — AB (ref <0.03)
TROPONIN I: 1.13 ng/mL — AB (ref <0.03)

## 2017-09-05 LAB — LACTIC ACID, PLASMA: LACTIC ACID, VENOUS: 3.3 mmol/L — AB (ref 0.5–1.9)

## 2017-09-05 LAB — MRSA PCR SCREENING: MRSA BY PCR: NEGATIVE

## 2017-09-05 MED ORDER — SODIUM CHLORIDE 0.9 % IV SOLN
3.0000 g | Freq: Four times a day (QID) | INTRAVENOUS | Status: DC
  Administered 2017-09-05 – 2017-09-07 (×8): 3 g via INTRAVENOUS
  Filled 2017-09-05 (×10): qty 3

## 2017-09-05 MED ORDER — PERFLUTREN LIPID MICROSPHERE
1.0000 mL | INTRAVENOUS | Status: AC | PRN
  Administered 2017-09-05: 2 mL via INTRAVENOUS
  Filled 2017-09-05: qty 10

## 2017-09-05 MED ORDER — CHLORHEXIDINE GLUCONATE 0.12% ORAL RINSE (MEDLINE KIT)
15.0000 mL | Freq: Two times a day (BID) | OROMUCOSAL | Status: DC
  Administered 2017-09-05: 15 mL via OROMUCOSAL

## 2017-09-05 MED ORDER — DEXTROSE-NACL 5-0.9 % IV SOLN
INTRAVENOUS | Status: DC
  Administered 2017-09-05 – 2017-09-08 (×4): via INTRAVENOUS

## 2017-09-05 MED ORDER — CHLORHEXIDINE GLUCONATE 0.12% ORAL RINSE (MEDLINE KIT)
15.0000 mL | Freq: Two times a day (BID) | OROMUCOSAL | Status: DC
  Administered 2017-09-05 – 2017-09-08 (×7): 15 mL via OROMUCOSAL

## 2017-09-05 MED ORDER — ORAL CARE MOUTH RINSE
15.0000 mL | Freq: Four times a day (QID) | OROMUCOSAL | Status: DC
  Administered 2017-09-05: 15 mL via OROMUCOSAL

## 2017-09-05 MED ORDER — ORAL CARE MOUTH RINSE
15.0000 mL | OROMUCOSAL | Status: DC
  Administered 2017-09-05 – 2017-09-08 (×32): 15 mL via OROMUCOSAL

## 2017-09-05 NOTE — Progress Notes (Addendum)
Decreased urine output, since 1800. Myoclonus-like/jerking activity present at rest and with stimulation. No seizure activity noted Reported to MD.

## 2017-09-05 NOTE — Progress Notes (Signed)
PULMONARY / CRITICAL CARE MEDICINE   Name: Sheila Hogan MRN: [id number] DOB: 1956-04-22    ADMISSION DATE:  08/26/2017 CONSULTATION DATE:  09/14/2017  REFERRING MD:  Dr. Kathrynn Humble  CHIEF COMPLAINT:  Cardiac arrest  SUBJECTIVE:  Intubated on the ventilator. Spoke on phone with daughters Temari and Judson Roch [facility]) to provide update on grim prognosis.  Judson Roch informs me patient has a DNR order and believes her mother would want to be comfortable.  She is going to contact rest of her family in [place] and discuss proceeding with withdrawal of care.    VITAL SIGNS: BP (!) 139/97   Pulse (!) 117   Temp (!) 100.8 F (38.2 C)   Resp (!) 24   Ht 5' 5"  (1.651 m)   Wt 183 lb 10.3 oz (83.3 kg)   SpO2 98%   BMI 30.56 kg/m   HEMODYNAMICS:    VENTILATOR SETTINGS: Vent Mode: PRVC FiO2 (%):  [80 %-100 %] 80 % Set Rate:  [14 bmp-24 bmp] 24 bmp Vt Set:  [460 mL-550 mL] 550 mL PEEP:  [5 cmH20] 5 cmH20 Plateau Pressure:  [16 cmH20-22 cmH20] 19 cmH20  INTAKE / OUTPUT: I/O last 3 completed shifts: In: 2732.5 [I.V.:732.5; IV [id number]:3790] Out: 240 [Urine:475; Emesis/NG output:70]  PHYSICAL EXAMINATION: General: comatose female lying in bed. HEENT: NCAT, pupils equal and minimally reactive Cardiac: RRR Pulm: mechanical breath sounds, symmetric rise and fall Abd: soft, nondistended Ext: warm and well perfused, no pedal edema Skin: multiple tattoo's Neuro: Myoclonus precipitated by noxious stimuli, no gag  LABS:  BMET Recent Labs  Lab 09/19/2017 1455 09/20/2017 1522 09/22/2017 2019 09/05/17 0719  NA 138 138 141 140  K 5.7* 5.2* 5.0 4.1  CL 108 112* 114* 108  CO2 13*  --  19* 18*  BUN 24* 29* 23* 30*  CREATININE 1.34* 1.20* 1.28* 2.05*  GLUCOSE 235* 238* 149* 131*    Electrolytes Recent Labs  Lab 08/27/2017 1455 09/11/2017 1710 09/19/2017 2019 09/05/17 0719  CALCIUM 8.7*  --  7.3* 6.9*  MG  --  1.8  --   --   PHOS  --  6.9*  --   --     CBC Recent Labs  Lab 09/12/2017 1455  09/21/2017 1522 09/05/17 0121  WBC 33.1*  --  24.2*  HGB 12.9 14.6 14.4  HCT 41.5 43.0 45.0  PLT 202  --  105*    Coag's Recent Labs  Lab 09/19/2017 2019  INR 1.28    Sepsis Markers Recent Labs  Lab 09/15/2017 1706 09/23/2017 1710 09/03/2017 2019 09/05/17 0719  LATICACIDVEN 2.6*  --  3.1* 3.3*  PROCALCITON  --  <0.10  --   --     ABG Recent Labs  Lab 08/26/2017 1629 09/25/2017 1750 09/16/2017 2340  PHART 7.006* 7.125* 7.426  PCO2ART 60.9* 54.6* 22.5*  PO2ART 171.0* 110.0* 103    Liver Enzymes Recent Labs  Lab 09/09/2017 1455 09/05/17 0719  AST 71* 201*  ALT 49 78*  ALKPHOS 124 93  BILITOT 0.8 1.6*  ALBUMIN 2.7* 2.5*    Cardiac Enzymes Recent Labs  Lab 09/01/2017 2236 09/05/17 0121 09/05/17 0719  TROPONINI 0.38* 0.41* 0.98*    Glucose Recent Labs  Lab 09/01/2017 1709 09/15/2017 2016 09/22/2017 2345 09/05/17 0338 09/05/17 0800  GLUCAP 174* 124* 130* 112* 126*    Imaging Ct Head Wo Contrast  Result Date: 09/05/2017 CLINICAL DATA:  Altered level of consciousness, post cardiac arrest. EXAM: CT HEAD WITHOUT CONTRAST TECHNIQUE: Contiguous axial images were obtained  from the base of the skull through the vertex without intravenous contrast. COMPARISON:  None. FINDINGS: Brain: Ventricles are normal in size and configuration. All areas of the brain demonstrate normal gray-white matter differentiation. No mass, hemorrhage, edema or other evidence of acute parenchymal abnormality. No extra-axial hemorrhage. Vascular: There are chronic calcified atherosclerotic changes of the large vessels at the skull base. No unexpected hyperdense vessel. Skull: Normal. Negative for fracture or focal lesion. Sinuses/Orbits: Fairly extensive mucosal thickening throughout the ethmoid air cells and left maxillary sinus, of uncertain chronicity. Periorbital and retro-orbital soft tissues are unremarkable. Other: None. IMPRESSION: 1. No acute intracranial abnormality. No intracranial mass, hemorrhage  or edema. 2. Paranasal sinus disease, of uncertain chronicity. Electronically Signed   By: Franki Cabot M.D.   On: 09/17/2017 18:17   Dg Chest Port 1 View  Result Date: 09/05/2017 CLINICAL DATA:  Hypoxia EXAM: PORTABLE CHEST 1 VIEW COMPARISON:  September 04, 2017 FINDINGS: Endotracheal tube tip is 4.6 cm above the carina. Nasogastric tube tip and side port are below the diaphragm. No pneumothorax. There is underlying fibrotic change. There has been clearing of apparent edema from the upper lobes compared to 1 day prior. Currently there is atelectatic change in the bases with small pleural effusions. Heart is upper normal in size with pulmonary vascularity within normal limits. No adenopathy. No evident bone lesions. IMPRESSION: Tube positions as described without pneumothorax. Apparent clearing of interstitial edema from the upper lobes compared to 1 day prior. There is a degree of underlying fibrotic type change. There are small pleural effusions bilaterally with bibasilar atelectasis. Electronically Signed   By: Lowella Grip III M.D.   On: 09/05/2017 07:35   Dg Chest Port 1 View  Result Date: 09/24/2017 CLINICAL DATA:  Status post cardiac arrest.  Hypoxia. EXAM: PORTABLE CHEST 1 VIEW COMPARISON:  None. FINDINGS: Endotracheal tube tip is 5.0 cm above the carina. Nasogastric tube tip and side port are below the diaphragm. No pneumothorax. There is apparent fibrosis throughout the lungs bilaterally. There may be superimposed interstitial edema. There is no consolidation. The heart size is normal. Pulmonary vascularity appears grossly normal on this supine portable chest examination. No adenopathy appreciable. No bone lesions. IMPRESSION: Tube positions as described without evident pneumothorax. Probable pulmonary fibrosis with questionable superimposed interstitial edema. No consolidation. Heart size normal. Electronically Signed   By: Lowella Grip III M.D.   On: 09/03/2017 15:10   STUDIES:  1/10  Head CT >>   EEG >> discussed with neuro, grim prognosis TTE >>  CULTURES: 1/10 BC x 2 >> 1/11 Trach aspirate >>  ANTIBIOTICS: Vanc/Zosyn 1/10 >> 1/11 Unasyn 1/11  SIGNIFICANT EVENTS: 1/10 Admit  LINES/TUBES: PIV x 2 1/10 ETT >> 1/10 OGT >> 1/10 foley >>  DISCUSSION: 38 yoF with little known PMH other than prior opioid abuse. Medication bottles filled in [place], but has [employer]. Practically full xanax 37m bottle filled in 03/2017 with other mixed in pills unknown).  Patient here visiting a friend with witnessed unresponsiveness 1/10, no bystander CPR, found in [place] ~28 mins, after 4 rounds epi and Narcan 237m  Hypotensive, hypothermic (no environmental exposure known, ? If witnessed unresponsive why so hypothermic), unresponsive in ER. Unclear etiology of what precipitated  Events.   ASSESSMENT / PLAN:  PULMONARY A: Acute hypoxic/ hypercapnic respiratory failure  ? Aspiration PNA P:   Full MV support, PRVC Duonebs q4 prn VAP See ID  CARDIOVASCULAR A:  Asystolic Cardiac Arrest with Approximately 28 minute  downtime Concern for TCA and possible propanolol overdose given wide QRS and hx depression P:  Tele D5-NS at 75cc/hr TTE pending  RENAL A:   AKI - unknown baseline Hyperkalemia Anion gap met acidosis 2/2 lactic acidosis  P:   Follow BMP / urinary output/ daily weights Replace electrolytes as indicated  GASTROINTESTINAL A:   NPO Transaminitis ? Hx of GI issues- reportedly taking Tums, pepcid P:   Likely 2/2 shock liver NPO PPI for SUP  HEMATOLOGIC A:   Leukocytosis  P:  Trend CBC Heparin SQ and SCDs   INFECTIOUS A:   Leukocytosis  Low grade fevers P:   BC x 2, trach aspirate, UA PCT low Flu negative Trend WBC / fever curve DC Empiric Vanc and Zosyn Add Unasyn although infection unlikely  ENDOCRINE A:   Hyperglycemia  P:   CBG q 4 SSI   NEUROLOGIC A:   Anoxic brain injury GCS 3-4 P:   Neuro  eval appreciated.  Overall, grim prognosis.  Discussed with daughters via the phone today and explained her devastating neurologic injury and prognosis.  Judson Roch [facility]) will contact other family members regarding withdrawal of care and moving towards comfort EEG pending   FAMILY  - Updates:  Daughters updated today.  Sarah informed me that mom is DNR so will change in the chart.  - Inter-disciplinary family meet or Palliative Care meeting due by:  09/11/2017  [facility]  [phone] 09/05/2017, 10:59 AM

## 2017-09-05 NOTE — Consult Note (Signed)
Neurology Consultation Reason for Consult: Anoxic Brain Injury Referring Physician: Elisabeth CaraAlva, R  CC: Coma  History is obtained from:chart  HPI: Sheila Hogan is a 62 y.o. female presenting with cardiac arrest. ROSC after 30 minutes. Theraputic hypothermia was not pursued. She has developed generalized myoclonus and neurology has been consulted for prognosis.      ROS: Unable to assess secondary to patient's altered mental status.    PMH: Unable to assess secondary to patient's altered mental status.     FHx: Unable to assess secondary to patient's altered mental status.   Social History:  has no tobacco, alcohol, and drug history on file.   Exam: Current vital signs: BP (!) 151/89   Pulse (!) 118   Temp (!) 100.6 F (38.1 C)   Resp (!) 24   Ht 5\' 5"  (1.651 m)   Wt 83.3 kg (183 lb 10.3 oz)   SpO2 97%   BMI 30.56 kg/m  Vital signs in last 24 hours: Temp:  [89.1 F (31.7 C)-100.6 F (38.1 C)] 100.6 F (38.1 C) (01/11 0900) Pulse Rate:  [47-118] 118 (01/11 0900) Resp:  [14-24] 24 (01/11 0900) BP: (79-166)/(61-130) 151/89 (01/11 0900) SpO2:  [97 %-100 %] 97 % (01/11 0900) FiO2 (%):  [80 %-100 %] 80 % (01/11 0837) Weight:  [83.3 kg (183 lb 10.3 oz)-87 kg (191 lb 12.8 oz)] 83.3 kg (183 lb 10.3 oz) (01/11 0500)   Physical Exam  Constitutional: Appears well-developed and well-nourished.  Psych: Comatose Eyes: No scleral injection HENT: ET tube in place Head: Normocephalic.  Cardiovascular: Normal rate and regular rhythm.  Respiratory: ventilated.  GI: Soft.  No distension. There is no tenderness.  Skin: WDI  Neuro: Mental Status: Patient is Comatose Cranial Nerves: II:  Pupils are equal, round, and reactive to light.  Does nto blink to threat III,IV, VI: Dolls eye repsonse normal  V: VII: Corneal present on left, absent on right.  Motor: Nox stim precipitates myoclonus, no clear withdrawal  Sensory: As above Deep Tendon Reflexes: 2+ and symmetric in the  biceps and patellae.  Cerebellar: Does not perform.   I have reviewed labs in epic and the results pertinent to this consultation are: Cr elevated at 2.05  I have reviewed the images obtained:CT head - normal on arrival  Impression: 62 yo F s/p anoxic brina injury from cardiac arrest. She has poor prognosis based on generalized myoclonus within 24 hours of arrest, burst suppression EEG. At this time, I feel that we have enough information to say that she does not have any chance of recovery to an independent state.   Recommendations: 1) EEG recommended(available for review at the time of this note) 2) Recommend comfort measures only 3) No further recommendations at this time. Please call if neurology can be of any assistance.    Ritta SlotMcNeill Deepti Gunawan, MD [facility] [phone]  If 7pm- 7am, please page neurology on call as listed in AMION.

## 2017-09-05 NOTE — Progress Notes (Signed)
Pharmacy Antibiotic Note  Sheila LundRebecca Hogan is a 62 y.o. female admitted on 2018/06/27 s/p cardiac arrest. Started on vancomycin and Zosyn on admit. Antibiotics de-escalated and pharmacy has been consulted for Unasyn dosing. WBC 33.1, hypothermic on admit, now AF. LA 9.76 >> 3.1, PCT < 0.1.  Plan: Start Unasyn 3g IV q6h F/u renal function, clinical status, LOT  Height: 5\' 5"  (165.1 cm) Weight: 183 lb 10.3 oz (83.3 kg) IBW/kg (Calculated) : 57  Temp (24hrs), Avg:92.7 F (33.7 C), Min:89.1 F (31.7 C), Max:99.9 F (37.7 C)  Recent Labs  Lab 09-30-17 1455 09-30-17 1522 09-30-17 1706 09-30-17 2019 09/05/17 0121 09/05/17 0719  WBC 33.1*  --   --   --  24.2*  --   CREATININE 1.34* 1.20*  --  1.28*  --   --   LATICACIDVEN  --  9.76* 2.6* 3.1*  --  3.3*    Estimated Creatinine Clearance: 50.4 mL/min (A) (by C-G formula based on SCr of 1.28 mg/dL (H)).    No Known Allergies  Antimicrobials this admission: Vancomycin 1/10 >> 1/11 Zosyn 1/10 >> 1/11 Unasyn 1/11 >>  Dose adjustments this admission: None  Microbiology results: 1/10 MRSA PCR: negative 1/10 BCx x2: collected 1/10 trach aspirate: ordered  Thank you for allowing pharmacy to be a part of this patient's care.  Roderic ScarceErin N. Zigmund Danieleja, PharmD PGY1 Pharmacy Resident Pager: [phone] 09/05/2017 8:37 AM

## 2017-09-05 NOTE — Progress Notes (Signed)
Echocardiogram 2D Echocardiogram has been performed.  Sheila PartridgeBrooke S Paeton Hogan 09/05/2017, 10:56 AM

## 2017-09-05 NOTE — Progress Notes (Signed)
STAT EEG completed; Dr Amada JupiterKirkpatrick notified. Results pending.

## 2017-09-05 NOTE — Progress Notes (Signed)
IO removed from left lower leg by Carlyon ProwsShakiera Whyte RN.

## 2017-09-05 NOTE — Progress Notes (Addendum)
Nutrition Brief Note  Chart reviewed. Patient admitted s/p cardiac arrest; she has a poor prognosis per review of progress notes. Discussed patient in ICU rounds and with RN today. No plans to begin tube feeding as will likely withdraw care when family arrives from out of town. No nutrition interventions warranted at this time.  Please consult as needed.   Joaquin CourtsKimberly Zaviyar Rahal, RD, LDN, CNSC Pager [phone] After Hours Pager [phone]

## 2017-09-06 DIAGNOSIS — J9601 Acute respiratory failure with hypoxia: Secondary | ICD-10-CM

## 2017-09-06 LAB — GLUCOSE, CAPILLARY
GLUCOSE-CAPILLARY: 117 mg/dL — AB (ref 65–99)
GLUCOSE-CAPILLARY: 126 mg/dL — AB (ref 65–99)
GLUCOSE-CAPILLARY: 146 mg/dL — AB (ref 65–99)
Glucose-Capillary: 135 mg/dL — ABNORMAL HIGH (ref 65–99)
Glucose-Capillary: 130 mg/dL — ABNORMAL HIGH (ref 65–99)
Glucose-Capillary: 124 mg/dL — ABNORMAL HIGH (ref 65–99)

## 2017-09-06 LAB — CBC
HCT: 43.5 % (ref 36.0–46.0)
Hemoglobin: 14.6 g/dL (ref 12.0–15.0)
MCH: 30.4 pg (ref 26.0–34.0)
MCHC: 33.6 g/dL (ref 30.0–36.0)
MCV: 90.4 fL (ref 78.0–100.0)
PLATELETS: 93 10*3/uL — AB (ref 150–400)
RBC: 4.81 MIL/uL (ref 3.87–5.11)
RDW: 15.2 % (ref 11.5–15.5)
WBC: 35.9 10*3/uL — ABNORMAL HIGH (ref 4.0–10.5)

## 2017-09-06 LAB — COMPREHENSIVE METABOLIC PANEL
ALT: 71 U/L — AB (ref 14–54)
AST: 153 U/L — ABNORMAL HIGH (ref 15–41)
Albumin: 2 g/dL — ABNORMAL LOW (ref 3.5–5.0)
Alkaline Phosphatase: 102 U/L (ref 38–126)
Anion gap: 18 — ABNORMAL HIGH (ref 5–15)
BUN: 44 mg/dL — ABNORMAL HIGH (ref 6–20)
CHLORIDE: 112 mmol/L — AB (ref 101–111)
CO2: 12 mmol/L — AB (ref 22–32)
CREATININE: 3.88 mg/dL — AB (ref 0.44–1.00)
Calcium: 6.5 mg/dL — ABNORMAL LOW (ref 8.9–10.3)
GFR calc non Af Amer: 12 mL/min — ABNORMAL LOW (ref >60)
GFR, EST AFRICAN AMERICAN: 14 mL/min — AB (ref >60)
Glucose, Bld: 132 mg/dL — ABNORMAL HIGH (ref 65–99)
POTASSIUM: 4.2 mmol/L (ref 3.5–5.1)
SODIUM: 142 mmol/L (ref 135–145)
Total Bilirubin: 1.4 mg/dL — ABNORMAL HIGH (ref 0.3–1.2)
Total Protein: 4.6 g/dL — ABNORMAL LOW (ref 6.5–8.1)

## 2017-09-06 LAB — TRIGLYCERIDES: Triglycerides: 170 mg/dL — ABNORMAL HIGH (ref <150)

## 2017-09-06 MED ORDER — ACETAMINOPHEN 160 MG/5ML PO SOLN
650.0000 mg | Freq: Four times a day (QID) | ORAL | Status: DC | PRN
Start: 2017-09-06 — End: 2017-09-08
  Administered 2017-09-06 (×2): 650 mg via ORAL
  Filled 2017-09-06 (×2): qty 20.3

## 2017-09-06 MED ORDER — PROPOFOL 1000 MG/100ML IV EMUL
5.0000 ug/kg/min | INTRAVENOUS | Status: DC
  Administered 2017-09-06: 40 ug/kg/min via INTRAVENOUS
  Administered 2017-09-06: 5 ug/kg/min via INTRAVENOUS
  Administered 2017-09-07: 39.906 ug/kg/min via INTRAVENOUS
  Administered 2017-09-07 – 2017-09-08 (×5): 40 ug/kg/min via INTRAVENOUS
  Administered 2017-09-08: 30 ug/kg/min via INTRAVENOUS
  Administered 2017-09-08 (×2): 40 ug/kg/min via INTRAVENOUS
  Filled 2017-09-06 (×11): qty 100

## 2017-09-06 NOTE — Progress Notes (Signed)
PULMONARY / CRITICAL CARE MEDICINE   Name: Sheila Hogan MRN: [id number] DOB: 26-Jan-1956    ADMISSION DATE:  09/19/2017 CONSULTATION DATE:  09/22/2017  REFERRING MD:  Dr. Kathrynn Humble  CHIEF COMPLAINT:  Cardiac arrest  SUBJECTIVE:  No events overnight, very low UOP and very tachypnic at this point  VITAL SIGNS: BP (!) 167/114   Pulse (!) 137   Temp (!) 101.1 F (38.4 C)   Resp (!) 29   Ht 5' 5" (1.651 m)   Wt 85.2 kg (187 lb 13.3 oz)   SpO2 95%   BMI 31.26 kg/m   HEMODYNAMICS:    VENTILATOR SETTINGS: Vent Mode: PRVC FiO2 (%):  [50 %-70 %] 50 % Set Rate:  [24 bmp] 24 bmp Vt Set:  [550 mL] 550 mL PEEP:  [5 cmH20] 5 cmH20 Plateau Pressure:  [19 cmH20-27 cmH20] 27 cmH20  INTAKE / OUTPUT: I/O last 3 completed shifts: In: 3032.5 [I.V.:2542.5; NG/GT:90; IV Piggyback:400] Out: 517 [Urine:247; Emesis/NG output:270]  PHYSICAL EXAMINATION: General: Unresponsive. HEENT: NCAT, pupils equal and minimally reactive, does not withdraw to pain, unarousable Cardiac: RRR, Nl S1/S2 and -M/R/G. Pulm: mechanical breath sounds, symmetric rise and fall Abd: Soft, NT, ND and +BS Ext: warm and well perfused, no pedal edema Skin: multiple tattoo's Neuro: Myoclonus precipitated by noxious stimuli, no gag  LABS:  BMET Recent Labs  Lab 09/07/2017 2019 09/05/17 0719 09/06/17 0444  NA 141 140 142  K 5.0 4.1 4.2  CL 114* 108 112*  CO2 19* 18* 12*  BUN 23* 30* 44*  CREATININE 1.28* 2.05* 3.88*  GLUCOSE 149* 131* 132*    Electrolytes Recent Labs  Lab 09/16/2017 1710 09/22/2017 2019 09/05/17 0719 09/06/17 0444  CALCIUM  --  7.3* 6.9* 6.5*  MG 1.8  --   --   --   PHOS 6.9*  --   --   --     CBC Recent Labs  Lab 08/31/2017 1455 09/03/2017 1522 09/05/17 0121 09/06/17 0444  WBC 33.1*  --  24.2* 35.9*  HGB 12.9 14.6 14.4 14.6  HCT 41.5 43.0 45.0 43.5  PLT 202  --  105* 93*    Coag's Recent Labs  Lab 09/19/2017 2019  INR 1.28    Sepsis Markers Recent Labs  Lab  09/06/2017 1706 08/26/2017 1710 09/14/2017 2019 09/05/17 0719  LATICACIDVEN 2.6*  --  3.1* 3.3*  PROCALCITON  --  <0.10  --   --     ABG Recent Labs  Lab 08/30/2017 1629 08/27/2017 1750 09/09/2017 2340  PHART 7.006* 7.125* 7.426  PCO2ART 60.9* 54.6* 22.5*  PO2ART 171.0* 110.0* 103    Liver Enzymes Recent Labs  Lab 09/12/2017 1455 09/05/17 0719 09/06/17 0444  AST 71* 201* 153*  ALT 49 78* 71*  ALKPHOS 124 93 102  BILITOT 0.8 1.6* 1.4*  ALBUMIN 2.7* 2.5* 2.0*    Cardiac Enzymes Recent Labs  Lab 09/05/17 0121 09/05/17 0719 09/05/17 1410  TROPONINI 0.41* 0.98* 1.13*    Glucose Recent Labs  Lab 09/05/17 1200 09/05/17 1527 09/05/17 2001 09/05/17 2336 09/06/17 0334 09/06/17 0839  GLUCAP 133* 134* 140* 133* 135* 146*    Imaging No results found. STUDIES:  1/10 Head CT >>   EEG >> discussed with neuro, grim prognosis TTE >>  CULTURES: 1/10 BC x 2 >> 1/11 Trach aspirate >>  ANTIBIOTICS: Vanc/Zosyn 1/10 >> 1/11 Unasyn 1/11  SIGNIFICANT EVENTS: 1/10 Admit  LINES/TUBES: PIV x 2 1/10 ETT >> 1/10 OGT >> 1/10 foley >>  DISCUSSION: 46 yoF  with little known PMH other than prior opioid abuse. Medication bottles filled in [place], but has [employer]. Practically full xanax 55m bottle filled in 03/2017 with other mixed in pills unknown).  Patient here visiting a friend with witnessed unresponsiveness 1/10, no bystander CPR, found in ABaring RRubyafter ~28 mins, after 4 rounds epi and Narcan 263m  Hypotensive, hypothermic (no environmental exposure known, ? If witnessed unresponsive why so hypothermic), unresponsive in ER. Unclear etiology of what precipitated  Events.   ASSESSMENT / PLAN:  PULMONARY A: Acute hypoxic/ hypercapnic respiratory failure  ? Aspiration PNA P:   Full vent support Terminal extubation when family arrives D/C further ABG CXR in AM  CARDIOVASCULAR A:  Asystolic Cardiac Arrest with Approximately 28 minute downtime Concern  for TCA and possible propanolol overdose given wide QRS and hx depression P:  Tele D5-NS at 75cc/hr D/C TEE DNR  RENAL A:   AKI - unknown baseline Hyperkalemia Anion gap met acidosis 2/2 lactic acidosis  P:   D/C further blood draws  GASTROINTESTINAL A:   NPO Transaminitis ? Hx of GI issues- reportedly taking Tums, pepcid P:   D/C TF D/C LFTs check  HEMATOLOGIC A:   Leukocytosis  P:  D/C CBC draws Heparin SQ and SCDs   INFECTIOUS A:   Leukocytosis  Low grade fevers P:   No sign of infection, d/c unasyn  ENDOCRINE A:   Hyperglycemia  P:   CBG q 4 SSI   NEUROLOGIC A:   Anoxic brain injury GCS 3-4 P:   Neuro eval appreciated.  Overall, grim prognosis.  Discussed with daughters via the phone today and explained her devastating neurologic injury and prognosis.  SaJudson RochPStratham Ambulatory Surgery Centerwill contact other family members regarding withdrawal of care and moving towards comfort No sedation  FAMILY  - Updates:  [facility] [facility]  - Inter-disciplinary family meet or Palliative Care meeting due by:  09/11/2017  The patient is critically ill with multiple organ systems failure and requires high complexity decision making for assessment and support, frequent evaluation and titration of therapies, application of advanced monitoring technologies and extensive interpretation of multiple databases.   Critical Care Time devoted to patient care services described in this note is  35  Minutes. This time reflects time of care of this signee Dr WeJennet MaduroThis critical care time does not reflect procedure time, or teaching time or supervisory time of PA/NP/Med student/Med Resident etc but could involve care discussion time.  WeRush FarmerM.D. [facility]. Pager: [phone] hours pager: [phone]

## 2017-09-07 LAB — GLUCOSE, CAPILLARY
GLUCOSE-CAPILLARY: 117 mg/dL — AB (ref 65–99)
GLUCOSE-CAPILLARY: 120 mg/dL — AB (ref 65–99)
Glucose-Capillary: 115 mg/dL — ABNORMAL HIGH (ref 65–99)
Glucose-Capillary: 123 mg/dL — ABNORMAL HIGH (ref 65–99)
Glucose-Capillary: 123 mg/dL — ABNORMAL HIGH (ref 65–99)

## 2017-09-07 MED ORDER — INSULIN ASPART 100 UNIT/ML ~~LOC~~ SOLN
0.0000 [IU] | Freq: Three times a day (TID) | SUBCUTANEOUS | Status: DC
  Administered 2017-09-07: 1 [IU] via SUBCUTANEOUS

## 2017-09-07 NOTE — Progress Notes (Signed)
PULMONARY / CRITICAL CARE MEDICINE   Name: Sheila Hogan MRN: [id number] DOB: 1955-09-04    ADMISSION DATE:  09/12/2017 CONSULTATION DATE:  09/17/2017  REFERRING MD:  Dr. Kathrynn Humble  CHIEF COMPLAINT:  Cardiac arrest  SUBJECTIVE:  No events overnight per RN.  Awaiting family to arrive from out of town in order to proceed with terminal extubation.  She is reportedly doing better with propofol infusion to help with myoclonic jerking.  Tylenol for fevers.  Continues to have minimal urine output.    VITAL SIGNS: BP 109/82   Pulse (!) 106   Temp 98.8 F (37.1 C)   Resp (!) 25   Ht _0  (1.651 m)   Wt 196 lb 6.9 oz (89.1 kg)   SpO2 95%   BMI 32.69 kg/m   HEMODYNAMICS:    VENTILATOR SETTINGS: Vent Mode: PRVC FiO2 (%):  [50 %-70 %] 50 % Set Rate:  [24 bmp] 24 bmp Vt Set:  [550 mL] 550 mL PEEP:  [5 cmH20] 5 cmH20 Plateau Pressure:  [21 WUJ81-19 cmH20] 21 cmH20  INTAKE / OUTPUT: I/O last 3 completed shifts: In: 3786 [I.V.:2966; NG/GT:220; IV Piggyback:600] Out: 447 [Urine:47; Emesis/NG output:400]  PHYSICAL EXAMINATION: General: Unresponsive, sedated on Propofol. HEENT: NCAT, pupils equal and minimally reactive, does not withdraw to pain, unarousable Cardiac: mild tachy, regular rhythm, no m/r/g Pulm: mechanical breath sounds, symmetric rise and fall Abd: Soft, +BS, obese abdomen Ext: warm and well perfused, no pedal edema, SCD Skin: warm, dry with multiple tattoo's Neuro: No gag reflex  LABS:  BMET Recent Labs  Lab 09/22/2017 2019 09/05/17 0719 09/06/17 0444  NA 141 140 142  K 5.0 4.1 4.2  CL 114* 108 112*  CO2 19* 18* 12*  BUN 23* 30* 44*  CREATININE 1.28* 2.05* 3.88*  GLUCOSE 149* 131* 132*    Electrolytes Recent Labs  Lab 09/21/2017 1710 09/22/2017 2019 09/05/17 0719 09/06/17 0444  CALCIUM  --  7.3* 6.9* 6.5*  MG 1.8  --   --   --   PHOS 6.9*  --   --   --     CBC Recent Labs  Lab 09/19/2017 1455 09/13/2017 1522 09/05/17 0121 09/06/17 0444  WBC  33.1*  --  24.2* 35.9*  HGB 12.9 14.6 14.4 14.6  HCT 41.5 43.0 45.0 43.5  PLT 202  --  105* 93*    Coag's Recent Labs  Lab 08/26/2017 2019  INR 1.28    Sepsis Markers Recent Labs  Lab 09/13/2017 1706 09/22/2017 1710 09/25/2017 2019 09/05/17 0719  LATICACIDVEN 2.6*  --  3.1* 3.3*  PROCALCITON  --  <0.10  --   --     ABG Recent Labs  Lab 08/26/2017 1629 09/15/2017 1750 08/29/2017 2340  PHART 7.006* 7.125* 7.426  PCO2ART 60.9* 54.6* 22.5*  PO2ART 171.0* 110.0* 103    Liver Enzymes Recent Labs  Lab 09/10/2017 1455 09/05/17 0719 09/06/17 0444  AST 71* 201* 153*  ALT 49 78* 71*  ALKPHOS 124 93 102  BILITOT 0.8 1.6* 1.4*  ALBUMIN 2.7* 2.5* 2.0*    Cardiac Enzymes Recent Labs  Lab 09/05/17 0121 09/05/17 0719 09/05/17 1410  TROPONINI 0.41* 0.98* 1.13*    Glucose Recent Labs  Lab 09/06/17 0839 09/06/17 1221 09/06/17 1632 09/06/17 2015 09/06/17 2351 09/07/17 0405  GLUCAP 146* 130* 124* 117* 126* 117*    Imaging No results found. STUDIES:  1/10 Head CT >>   EEG >> discussed with neuro, grim prognosis TTE >> EF 14-78%, grade 1 diastolic  dysfunction  CULTURES: 1/10 BC x 2 >> 1/11 Trach aspirate >>  ANTIBIOTICS: Vanc/Zosyn 1/10 >> 1/11 Unasyn 1/11 > 1/13  SIGNIFICANT EVENTS: 1/10 Admit  LINES/TUBES: PIV x 2 1/10 ETT >> 1/10 OGT >> 1/10 foley >>  DISCUSSION: 51 yoF with little known PMH other than prior opioid abuse. Medication bottles filled in [place], but has [employer]. Practically full xanax 47m bottle filled in 03/2017 with other mixed in pills unknown).  Patient here visiting a friend with witnessed unresponsiveness 1/10, no bystander CPR[place], after 4 rounds epi and Narcan 232m  Hypotensive, hypothermic (no environmental exposure known, ? If witnessed unresponsive why so hypothermic), unresponsive in ER. Unclear etiology of what precipitated  Events.   ASSESSMENT / PLAN:  PULMONARY A: Acute  hypoxic/ hypercapnic respiratory failure  P:   Full vent support Terminal extubation when family arrives D/C further ABG  CARDIOVASCULAR A:  Asystolic Cardiac Arrest with Approximately 28 minute downtime Concern for TCA and possible propanolol overdose given wide QRS and hx depression P:  Tele D5-NS at 75cc/hr DNR  RENAL A:   AKI - unknown baseline with continued minimal UOP Hyperkalemia Anion gap met acidosis 2/2 lactic acidosis  P:   D/C further blood draws  GASTROINTESTINAL A:   NPO Transaminitis ? Hx of GI issues- reportedly taking Tums, pepcid P:   PPI Holding on TF No further lab draws  HEMATOLOGIC A:   Leukocytosis  P:  No further lab draws Heparin SQ and SCDs   INFECTIOUS A:   Leukocytosis  Low grade fevers like neurogenic related P:   No sign of infection, d/c unasyn  ENDOCRINE A:   Hyperglycemia  P:   CBG TID SSI   NEUROLOGIC A:   Anoxic brain injury GCS 3 P:   Neuro eval appreciated.  Overall, grim prognosis.  Discussed with daughters via the phone Friday and explained her devastating neurologic injury and prognosis.  [facility] contact other family members regarding withdrawal of care and moving towards comfort Propofol for sedation  FAMILY  - Updates:  [facility]  - Inter-disciplinary family meet or Palliative Care meeting due by:  09/11/2017  AnJule SerPGY3 [phone] Attending Note:  [age]ear old female s/p cardiac arrest and severe anoxic brain injury that is now DNR.  On exam, completely unresponsive with severe anoxia.  Not weaning on exam.  [place] [facility] [place] was the original plan.  But per communication with daughter, she is unsure if she can make and is possibly asking another family member to come in.  Will ask palliative care to see patient and coordinate the withdrawal process.   The  patient is critically ill with multiple organ systems failure and requires high complexity decision making for assessment and support, frequent evaluation and titration of therapies, application of advanced monitoring technologies and extensive interpretation of multiple databases.   Critical Care Time devoted to patient care services described in this note is  35  Minutes. This time reflects time of care of this signee Dr WeJennet MaduroThis critical care time does not reflect procedure time, or teaching time or supervisory time of PA/NP/Med student/Med Resident etc but could involve care discussion time.  WeRush FarmerM.D. [facility]/Critical Care Medicine. Pager: [phone] hours pager: [phone]

## 2017-09-08 DIAGNOSIS — Z515 Encounter for palliative care: Secondary | ICD-10-CM

## 2017-09-08 LAB — GLUCOSE, CAPILLARY: GLUCOSE-CAPILLARY: 97 mg/dL (ref 65–99)

## 2017-09-08 MED ORDER — LORAZEPAM 1 MG PO TABS: 1.0000 mg | ORAL_TABLET | ORAL | Status: DC | PRN

## 2017-09-08 MED ORDER — HYDROMORPHONE HCL 1 MG/ML IJ SOLN: 0.5000 mg | INTRAMUSCULAR | Status: DC | PRN

## 2017-09-08 MED ORDER — GLYCOPYRROLATE 0.2 MG/ML IJ SOLN: 0.2000 mg | INTRAMUSCULAR | Status: DC | PRN

## 2017-09-08 MED ORDER — BIOTENE DRY MOUTH MT LIQD: 15.0000 mL | OROMUCOSAL | Status: DC | PRN

## 2017-09-08 MED ORDER — SODIUM CHLORIDE 0.9 % IV SOLN: INTRAVENOUS | Status: DC

## 2017-09-08 MED ORDER — HALOPERIDOL LACTATE 2 MG/ML PO CONC: 0.5000 mg | ORAL | Status: DC | PRN

## 2017-09-08 MED ORDER — ONDANSETRON 4 MG PO TBDP: 4.0000 mg | ORAL_TABLET | Freq: Four times a day (QID) | ORAL | Status: DC | PRN

## 2017-09-08 MED ORDER — ACETAMINOPHEN 650 MG RE SUPP: 650.0000 mg | Freq: Four times a day (QID) | RECTAL | Status: DC | PRN

## 2017-09-08 MED ORDER — INSULIN ASPART 100 UNIT/ML ~~LOC~~ SOLN: 0.0000 [IU] | SUBCUTANEOUS | Status: DC

## 2017-09-08 MED ORDER — HALOPERIDOL 0.5 MG PO TABS: 0.5000 mg | ORAL_TABLET | ORAL | Status: DC | PRN

## 2017-09-08 MED ORDER — LORAZEPAM 2 MG/ML PO CONC: 1.0000 mg | ORAL | Status: DC | PRN

## 2017-09-08 MED ORDER — POLYVINYL ALCOHOL 1.4 % OP SOLN: 1.0000 [drp] | Freq: Four times a day (QID) | OPHTHALMIC | Status: DC | PRN

## 2017-09-08 MED ORDER — ACETAMINOPHEN 325 MG PO TABS: 650.0000 mg | ORAL_TABLET | Freq: Four times a day (QID) | ORAL | Status: DC | PRN

## 2017-09-08 MED ORDER — GLYCOPYRROLATE 1 MG PO TABS: 1.0000 mg | ORAL_TABLET | ORAL | Status: DC | PRN

## 2017-09-08 MED ORDER — HALOPERIDOL LACTATE 5 MG/ML IJ SOLN: 0.5000 mg | INTRAMUSCULAR | Status: DC | PRN

## 2017-09-08 MED ORDER — LORAZEPAM 2 MG/ML IJ SOLN: 1.0000 mg | INTRAMUSCULAR | Status: DC | PRN

## 2017-09-08 MED ORDER — ONDANSETRON HCL 4 MG/2ML IJ SOLN: 4.0000 mg | Freq: Four times a day (QID) | INTRAMUSCULAR | Status: DC | PRN

## 2017-09-09 LAB — CULTURE, BLOOD (ROUTINE X 2)
CULTURE: NO GROWTH
Special Requests: ADEQUATE

## 2017-09-10 LAB — CULTURE, BLOOD (ROUTINE X 2)
CULTURE: NO GROWTH
SPECIAL REQUESTS: ADEQUATE

## 2017-09-26 NOTE — Discharge Summary (Signed)
Sheila Hogan:  [name], [name]              ACCOUNT NO.:  [id number]  MEDICAL RECORD NO.:  [id number]  LOCATION:  2M10C                        FACILITY:  [facility]  PHYSICIAN:  Felipa EvenerWesam Jake Yacoub, MD  DATE OF BIRTH:  1955/12/08  DATE OF ADMISSION:  09/07/2017 DATE OF DISCHARGE:                              DISCHARGE SUMMARY   DEATH SUMMARY  PRIMARY DIAGNOSIS/CAUSE OF DEATH:  Asystolic cardiac arrest.  SECONDARY DIAGNOSES:  Acute respiratory failure with hypoxemia, aspiration pneumonia, hypercarbia, possible beta-blocker overdose, possible tricyclic acid antidepressant overdose, hyperkalemia, non-anion gap metabolic acidosis, lactic acidosis, transaminitis, leukocytosis, hyperglycemia, anoxic brain injury.  The patient is a 62 year old female with extensive past medical history, who presented to the hospital with concern for TCA and beta-blocker overdose, however, we were unable to substantiate that.  The patient was noted to be asystolic.  The patient was admitted to the intensive care unit on September 04, 2017, at 4:40 p.m.  It was chosen not to undergo the hypothermia protocol given the extent of the patient's neurologic as well as cardiac status.  We were waiting family arrival for withdrawal on 01/14.  The patient had persistent anoxic brain injury and the family reported that they will not be able to arrive, but family friend was to arrive and with coordination of plan of care team, decision was made to progress with comfort care.  The patient was extubated on morphine drip to expire shortly thereafter with the family at bedside.     Felipa EvenerWesam Jake Yacoub, MD     WJY/MEDQ  D:  09/15/2017  T:  09/15/2017  Job:  [id number]

## 2017-09-26 NOTE — Progress Notes (Signed)
eLink Physician-Brief Progress Note Patient Name: Sheila LundRebecca Stripling DOB: 1955/12/09 MRN: [id number]   Date of Service  09/20/2017  HPI/Events of Note  Notified of need for DVT prophylaxis - Patient is DNR. There is discussion in the chart of impending terminal extubation.   eICU Interventions  Will order: 1. Place SCD's.     Intervention Category Intermediate Interventions: Best-practice therapies (e.g. DVT, beta blocker, etc.)  Sommer,Steven Eugene 09/15/2017, 7:49 PM

## 2017-09-26 NOTE — Progress Notes (Addendum)
PMT follow up note/family meeting note.  I met with the patient's close friend whom she considered as a sister Sheila Hogan who is now at the bedside.   Brief life review performed:  Sheila Hogan is originally from [place], [place]. She has 3 children, a daughter Sheila Hogan who is her power of attorney, who has given [place] full permission to communicate with and made decisions with Sheila Hogan, patient is estranged from her 2 sons. 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 She was in town to attend a convention for the harm reduction program. She has been friends with Sheila Hogan for over 54 years.   The patient had instructed Sheila Hogan in the past, that when it was her time to go, she would not want to be kept alive by artificial means.   We reviewed one-way extubation, comfort care measures and next steps after the patient passes away in detail. All of Sheila Hogan questions answered to the best of my ability.   PLAN: 1. Extubate to room air 2. Do not use supplemental O2 3. D/C medications not contributing directly to comfort 4. End of life order set has been initiated 5. Chaplain consult to provide support to friend at bedside 6. Comfort cart for friend 7. Contact anatomical gift donation program, number in the patient's room, on her demise.  8. Patient's daughter Sheila Hogan would like to be on a phone call, when the patient is being extubated, Sheila Hogan will call her as the patient is being extubated to comfort care.   45 minutes spent [facility] palliative medicine team [phone]

## 2017-09-26 NOTE — Progress Notes (Signed)
Palliative Medicine RN Note: Rec'd call from Diane Person with [facility]. Patient is a registered donor with them.   At the time of death, please call the [facility] department at [phone] (Diane Person). This line is open 24/7. I completed the preliminary assessment with her today, but they will have additional questions at the time of death (re: wounds, surgery, communicable disease, etc.). Please have Ms. Mochizuki in as little clothing as possible (preferably a sheet/shroud). Sherrie Sportlon will contact the contracted mortuary for pick up.  Please call Ms Person above with any other questions.  Margret ChanceMelanie G. Elfrida Pixley, RN, BSN, [facility] Palliative Medicine Team 2017-11-13 1:57 PM Office [phone]

## 2017-09-26 NOTE — Progress Notes (Signed)
   02-Dec-2017 1600  Clinical Encounter Type  Visited With Patient (life long friend)  Visit Type Patient actively dying  Referral From Nurse  Consult/Referral To Chaplain   Responding to a [facility] for EOL.  Patient was not aware of my presence.  Long time friend flew in to be with her.  [place].  Friend was able to share a lot about her life and all she has done to help people and how many friends she has across the country.  Friend indicated the daughter has been in touch with healthcare providers about patients wishes.  The friend was very, [facility].  Will follow as needed. Chaplain Agustin CreeNewton Donia Yokum

## 2017-09-26 NOTE — Procedures (Signed)
Extubation Procedure Note  Patient Details:   Name: Sheila LundRebecca Hogan DOB: Apr 17, 1956 MRN: [id number]   Airway Documentation:     Evaluation  O2 sats: currently acceptable Complications: No apparent complications Patient did tolerate procedure well. Bilateral Breath Sounds: Rhonchi   No   Patient terminally extubated  Sharene SkeansSilva, Makita Blow C 10-12-17, 6:48 PM

## 2017-09-26 NOTE — Progress Notes (Signed)
Pt's daughter Maralyn SagoSarah on phone with Deana, pt's closest friend, who is at bedside and they both agree with comfort care for pt. Pt extubated at 18:48 and at 18:57 pt noted with no respirations and no heart rate heard. This is verified by this nurse as well as AnimatorAlex RN. Dr. Arsenio LoaderSommer made aware.

## 2017-09-26 NOTE — Progress Notes (Signed)
PULMONARY / CRITICAL CARE MEDICINE   Name: Sheila Hogan MRN: [id number] DOB: 04/12/1956    ADMISSION DATE:  08/29/2017 CONSULTATION DATE:  09/24/2017  REFERRING MD:  Dr. Kathrynn Humble  CHIEF COMPLAINT:  Cardiac arrest  SUBJECTIVE:  Unresponsive on ventilator VITAL SIGNS: BP (!) 132/95   Pulse (!) 28   Temp 98.4 F (36.9 C) (Bladder)   Resp (!) 109   Ht 5' 5"  (1.651 m)   Wt 199 lb 1.2 oz (90.3 kg)   SpO2 94%   BMI 33.13 kg/m   HEMODYNAMICS:    VENTILATOR SETTINGS: Vent Mode: PRVC FiO2 (%):  [50 %] 50 % Set Rate:  [24 bmp] 24 bmp Vt Set:  [550 mL] 550 mL PEEP:  [5 cmH20] 5 cmH20 Plateau Pressure:  [20 cmH20-26 cmH20] 26 cmH20  INTAKE / OUTPUT: I/O last 3 completed shifts: In: 4107.5 [I.V.:3707.5; NG/GT:200; IV Piggyback:200] Out: 990 [Urine:40; Emesis/NG output:950]  PHYSICAL EXAMINATION: General: This is a 62 year old female unresponsive on ventilator HEENT: Normocephalic atraumatic no jugular venous distention orally intubated Pulmonary: Scattered rhonchi no accessory use equal chest rise Cardiac: Regular rate and rhythm Extremities: Diffuse anasarca Abdomen: Soft nontender GU: An uric Neuro: GCS 3 LABS:  BMET Recent Labs  Lab 09/16/2017 2019 09/05/17 0719 09/06/17 0444  NA 141 140 142  K 5.0 4.1 4.2  CL 114* 108 112*  CO2 19* 18* 12*  BUN 23* 30* 44*  CREATININE 1.28* 2.05* 3.88*  GLUCOSE 149* 131* 132*    Electrolytes Recent Labs  Lab 09/03/2017 1710 08/30/2017 2019 09/05/17 0719 09/06/17 0444  CALCIUM  --  7.3* 6.9* 6.5*  MG 1.8  --   --   --   PHOS 6.9*  --   --   --     CBC Recent Labs  Lab 09/21/2017 1455 09/03/2017 1522 09/05/17 0121 09/06/17 0444  WBC 33.1*  --  24.2* 35.9*  HGB 12.9 14.6 14.4 14.6  HCT 41.5 43.0 45.0 43.5  PLT 202  --  105* 93*    Coag's Recent Labs  Lab 09/20/2017 2019  INR 1.28    Sepsis Markers Recent Labs  Lab 09/18/2017 1706 09/03/2017 1710 09/01/2017 2019 09/05/17 0719  LATICACIDVEN 2.6*  --  3.1* 3.3*   PROCALCITON  --  <0.10  --   --     ABG Recent Labs  Lab 08/29/2017 1629 08/28/2017 1750 09/18/2017 2340  PHART 7.006* 7.125* 7.426  PCO2ART 60.9* 54.6* 22.5*  PO2ART 171.0* 110.0* 103    Liver Enzymes Recent Labs  Lab 09/02/2017 1455 09/05/17 0719 09/06/17 0444  AST 71* 201* 153*  ALT 49 78* 71*  ALKPHOS 124 93 102  BILITOT 0.8 1.6* 1.4*  ALBUMIN 2.7* 2.5* 2.0*    Cardiac Enzymes Recent Labs  Lab 09/05/17 0121 09/05/17 0719 09/05/17 1410  TROPONINI 0.41* 0.98* 1.13*    Glucose Recent Labs  Lab 09/07/17 0405 09/07/17 0834 09/07/17 1253 09/07/17 1649 09/07/17 2141 September 24, 2017 0751  GLUCAP 117* 115* 120* 123* 123* 97    Imaging No results found. STUDIES:  1/10 Head CT >>   EEG >> discussed with neuro, grim prognosis TTE >> EF 61-60%, grade 1 diastolic dysfunction  CULTURES: 1/10 BC x 2 >> 1/11 Trach aspirate >>  ANTIBIOTICS: Vanc/Zosyn 1/10 >> 1/11 Unasyn 1/11 > 1/13  SIGNIFICANT EVENTS: 1/10 Admit  LINES/TUBES: PIV x 2 1/10 ETT >> 1/10 OGT >> 1/10 foley >>  ASSESSMENT / PLAN:   Acute hypoxic/ hypercapnic respiratory failure  Asystolic Cardiac Arrest with Approximately 28  minute downtime Concern for TCA and possible propanolol overdose given wide QRS and hx depression Anoxic brain injury w/ GCS 3 AKI - unknown baseline with continued minimal UOP Hyperkalemia Anion gap met acidosis 2/2 lactic acidosis  NPO Transaminitis ? Hx of GI issues- reportedly taking Tums, pepcid Leukocytosis  Hyperglycemia    Discussion This a 62 year old female status post what appeared to be polysubstance overdose.  Unknown downtime with Ross obtained after 28 minutes.  Resulting in profound anoxic brain injury.  She has myoclonic at times, her prognosis for meaningful recovery is poor, with best case scenario being persistent vegetative state.  The case has been discussed with the daughters at home, plan is to work towards withdrawal of care very soon.  We are  waiting on further communication via family.  Based on the futility of the situation we have stopped drawing labs and providing only supportive care  Plan KVO IV fluids Continue propofol for myoclonus No further labs Discontinue sliding scale insulin Awaiting friend to arrive, with plans for terminal extubation  Appreciate palliative care support  Erick Colace ACNP-BC [place] Pager # [phone] OR # [phone] if no answer  Attending Note:  62 year old female with PMH above presenting post cardiac arrest with severe anoxic brain injury.  Remains completely unresponsive on exam.  No CXR ordered.  Discussed with PCCM-NP, bedside RN and palliative care.  Patient has no reasonable chance at recovery.  Neuro note appreciated.  Will continue full support for now.  Plan on comfort measures later on today.  The patient is critically ill with multiple organ systems failure and requires high complexity decision making for assessment and support, frequent evaluation and titration of therapies, application of advanced monitoring technologies and extensive interpretation of multiple databases.   Critical Care Time devoted to patient care services described in this note is  35  Minutes. This time reflects time of care of this signee Dr Jennet Maduro. This critical care time does not reflect procedure time, or teaching time or supervisory time of PA/NP/Med student/Med Resident etc but could involve care discussion time.  Rush Farmer, M.D. [facility] Pulmonary/Critical Care Medicine. Pager: [phone]. After hours pager: [phone].

## 2017-09-26 NOTE — Consult Note (Signed)
Consultation Note Date: 09-28-17   Patient Name: Sheila Hogan  DOB: 17-Sep-1955  MRN: [id number]  Age / Sex: 62 y.o., female  PCP: Patient, No Pcp Per Referring Physician: Renee Pain, MD  Reason for Consultation: Establishing goals of care, Terminal Care and Withdrawal of life-sustaining treatment  HPI/Patient Profile: 62 y.o. female  admitted on 09/24/2017   Clinical Assessment and Goals of Care:  62 year old lady, reported to be originally from [place], has family living in [place], it is unknown for how long she had been living in [place], admitted to the [facility] status post cardiac arrest and severe anoxic brain injury, remains intubated, on propofol, mechanically ventilated, has been evaluated by urology as well as critical care medicine colleagues, with possible downtime with return of spontaneous circulation obtained after 28 minutes, hence great concern for the patient having suffered serious anoxic brain injury.  A palliative consultation has been requested for continuation of goals of care discussions, to facilitate terminal care and compassionate liberation from mechanical means and measures and to ensure comfort measures at end-of-life.  The patient is resting in bed. She does not withdraw to painful stimulus. She does not open eyes. She does not follow commands. She is sedated, intubated, mechanically ventilated. She is currently on propofol. This is no family present at the bedside. Discussed with bedside RN.  Call placed and discussed with daughter Judson Roch. Additionally call placed and also discussed with patient's friend/sister Elon Alas. See discussions/recommendations below. Thank you for the consult.  NEXT OF KIN  daughter Jimmey Ralph [phone] says she is power of attorney, oldest child for patient.   Friend/sister Elon Alas [phone]. Judson Roch says Ms Mel Almond  can have any/all information pertaining to the patient and is authorized to make medical decisions for the patient on her behalf as the patient has known her for a long time.   Patient has one daughter and 2 sons. Daughter Judson Roch states that the patient was living in [place], Judson Roch doesn't know when the patient arrived in [place]. Judson Roch states that the patient's 2 sons are aware of her current situation and hospitalization. We don't have her 2 sons' numbers.    DISCUSSIONS/SUMMARY OF RECOMMENDATIONS    Call placed and discussed with daughter, power of attorney Judson Roch who is in [place] at [phone], then call placed and also discussed with friend/sister Elon Alas at [phone]:    Both Judson Roch and Tilda Burrow are appreciative of the information they have received from medical staff, fully recognize that the patient is s/p cardiac arrest, has severe anoxic brain injury, remains unresponsive and has no scope of meaningful recovery.   They are in agreement and understanding of one-way extubation and full scope of comfort measures so as to allow for the patient to pass away naturally and peacefully. It was the patient's wishes that her body be donated for medical education, hence, friend/sister Ms Mel Almond is currently driving to [employer] to complete anatomical gift donation paperwork, after which, she will arrive at [facility] and wants to be present  when the patient is extubated to comfort care.    I will meet with Ms Mel Almond later today 15-Sep-2017, anticipate one-way extubation, establishment of full scope of comfort measures and high likelihood of anticipated hospital death after the patient is extubated.    Further recommendations to follow once patient's friend/sister Ms Mel Almond arrives at the hospital. I have provided Ms Mel Almond with our palliative department contact information. Await a face-to-face family meeting with her soon.   Thank you for the consult, we will continue to follow along.    Code Status/Advance Care Planning:  DNR    Symptom Management:    as above   Palliative Prophylaxis:   Delirium Protocol  Additional Recommendations (Limitations, Scope, Preferences):  Full Comfort Care  Psycho-social/Spiritual:   Desire for further Chaplaincy support:yes  Additional Recommendations: Education on Hospice  Prognosis:   Hours - Days  Discharge Planning: Anticipated Hospital Death      Primary Diagnoses: Present on Admission: . Cardiac arrest ([place])   I have reviewed the medical record, interviewed the patient and family, and examined the patient. The following aspects are pertinent.  No past medical history on file. Social History   Socioeconomic History  . Marital status: Unknown    Spouse name: Not on file  . Number of children: Not on file  . Years of education: Not on file  . Highest education level: Not on file  Social Needs  . Financial resource strain: Not on file  . Food insecurity - worry: Not on file  . Food insecurity - inability: Not on file  . Transportation needs - medical: Not on file  . Transportation needs - non-medical: Not on file  Occupational History  . Not on file  Tobacco Use  . Smoking status: Not on file  Substance and Sexual Activity  . Alcohol use: Not on file  . Drug use: Not on file  . Sexual activity: Not on file  Other Topics Concern  . Not on file  Social History Narrative  . Not on file   No family history on file. Scheduled Meds: . chlorhexidine gluconate (MEDLINE KIT)  15 mL Mouth Rinse BID  . fentaNYL (SUBLIMAZE) injection  50 mcg Intravenous Once  . heparin  5,000 Units Subcutaneous Q8H  . insulin aspart  0-9 Units Subcutaneous Q4H  . ipratropium-albuterol  3 mL Nebulization Q4H  . mouth rinse  15 mL Mouth Rinse 10 times per day  . pantoprazole (PROTONIX) IV  40 mg Intravenous Q12H   Continuous Infusions: . sodium chloride 250 mL (09/06/17 2200)  . dextrose 5 % and 0.9% NaCl 75 mL/hr  at 09-15-2017 0322  . propofol (DIPRIVAN) infusion 40 mcg/kg/min (September 15, 2017 0805)   PRN Meds:.sodium chloride, acetaminophen (TYLENOL) oral liquid 160 mg/5 mL, albuterol Medications Prior to Admission:  Prior to Admission medications   Medication Sig Start Date End Date Taking? Authorizing Provider  alprazolam Duanne Moron) 2 MG tablet Take 2 mg by mouth at bedtime as needed for sleep.   Yes [provider]  amitriptyline (ELAVIL) 10 MG tablet Take 10 mg by mouth at bedtime.   Yes [provider]  calcium carbonate (TUMS - DOSED IN MG ELEMENTAL CALCIUM) 500 MG chewable tablet Chew 1 tablet by mouth every 3 (three) hours as needed for indigestion or heartburn.   Yes [provider]  cyclobenzaprine (FLEXERIL) 10 MG tablet Take 10 mg by mouth 3 (three) times daily as needed for muscle spasms.   Yes [provider]  gabapentin (  NEURONTIN) 300 MG capsule Take 600 mg by mouth 3 (three) times daily.   Yes [provider]  linaclotide (LINZESS) 145 MCG CAPS capsule Take 145 mcg by mouth every other day.   Yes [provider]  pantoprazole (PROTONIX) 40 MG tablet Take 40 mg by mouth daily.   Yes [provider]  propranolol (INDERAL) 20 MG tablet Take 20 mg by mouth 2 (two) times daily.   Yes [provider]  ranitidine (ZANTAC) 150 MG tablet Take 150 mg by mouth 2 (two) times daily.   Yes [provider]  rizatriptan (MAXALT-MLT) 10 MG disintegrating tablet Take 10 mg by mouth once as needed for migraine. May repeat in 2 hours if needed   Yes [provider]  Fluticasone-Salmeterol (ADVAIR DISKUS) 500-50 MCG/DOSE AEPB Inhale 1 puff into the lungs 2 (two) times daily.    [provider]   No Known Allergies Review of Systems Non verbal on vent and sedation  Physical Exam Unresponsive, young person who is sedated, intubated and mechanically ventilated Mechanical breath sounds S1 S2 Has tattoos UE No gag  reflex, does not withdraw to painful stimulus Abdomen soft, some what obese No LE edema SCDs  Vital Signs: BP (!) 132/95   Pulse (!) 28   Temp 98.4 F (36.9 C) (Bladder)   Resp (!) 109   Ht _0  (1.651 m)   Wt 90.3 kg (199 lb 1.2 oz)   SpO2 94%   BMI 33.13 kg/m  Pain Assessment: CPOT      PPS 10% SpO2: SpO2: 94 % O2 Device:SpO2: 94 % O2 Flow Rate: .   IO: Intake/output summary:   Intake/Output Summary (Last 24 hours) at 09-24-2017 1110 Last data filed at Sep 24, 2017 0800 Gross per 24 hour  Intake 2191.53 ml  Output 365 ml  Net 1826.53 ml    LBM: Last BM Date: (PTA ) Baseline Weight: Weight: 87 kg (191 lb 12.8 oz) Most recent weight: Weight: 90.3 kg (199 lb 1.2 oz)     Palliative Assessment/Data:   Flowsheet Rows     Most Recent Value  Intake Tab  Referral Department  Critical care  Unit at Time of Referral  ICU  Palliative Care Primary Diagnosis  Neurology  Palliative Care Type  New Palliative care  Reason for referral  Clarify Goals of Care  Date first seen by Palliative Care  Sep 24, 2017  Clinical Assessment  Palliative Performance Scale Score  10%  Pain Max last 24 hours  2  Pain Min Last 24 hours  1  Dyspnea Max Last 24 Hours  2  Dyspnea Min Last 24 hours  1  Psychosocial & Spiritual Assessment  Palliative Care Outcomes  Patient/Family meeting held?  Yes  Who was at the meeting?  patient on the vent, not decisional, discussed with daughter and sister.   Palliative Care Outcomes  Changed to focus on comfort, Provided psychosocial or spiritual support      Time In:  10 Time Out:  11.10 Time Total:  70 min  Greater than 50%  of this time was spent counseling and coordinating care related to the above assessment and plan.  Signed by: Loistine Chance, MD  [phone]  Please contact Palliative Medicine Team phone at [phone] for questions and concerns.  For individual provider: See Shea Evans

## 2017-09-26 DEATH — deceased

## 2018-09-18 IMAGING — CT CT HEAD W/O CM
4 series · 14 of 47 positions shown, 16 images · non-contrast
Comparison: None.

CLINICAL DATA: Altered level of consciousness, post cardiac arrest.

EXAM:
CT HEAD WITHOUT CONTRAST
TECHNIQUE: Contiguous axial images were obtained from the base of the skull
through the vertex without intravenous contrast.

[Series 3: head wo · axial · 0.45mm/px · z∈[+1242,+1362]mm · 7 of 33 slices shown, 9 images]
[im 5/33  brain]
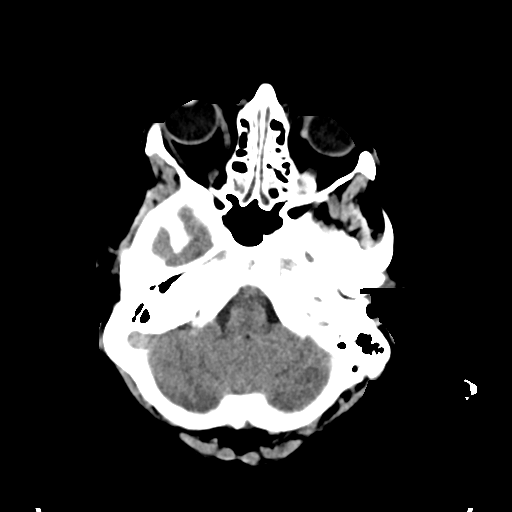
[im 5/33  bone]
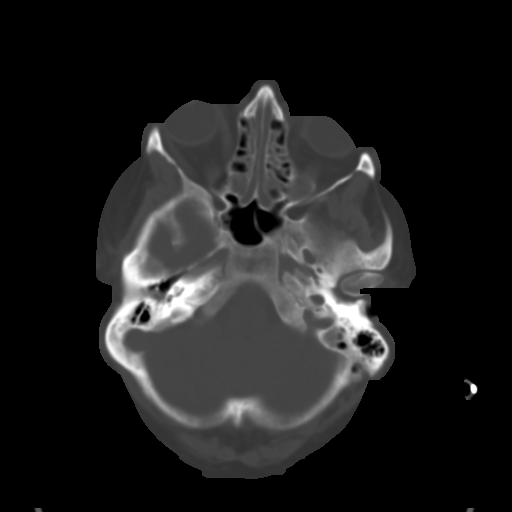
[im 9/33  brain]
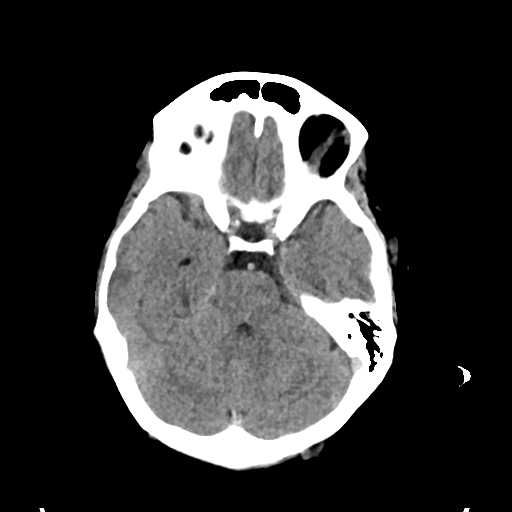
[im 13/33  brain]
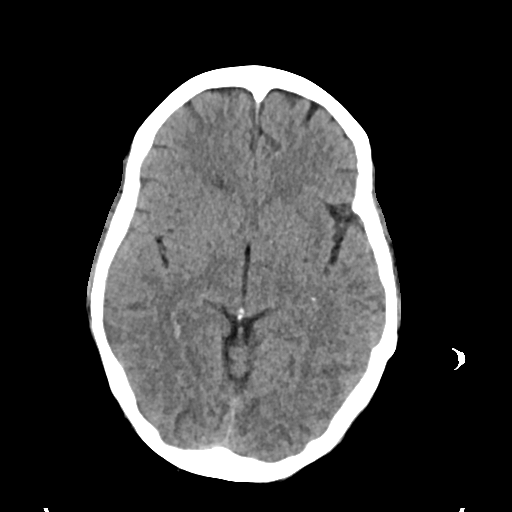
[im 17/33  brain]
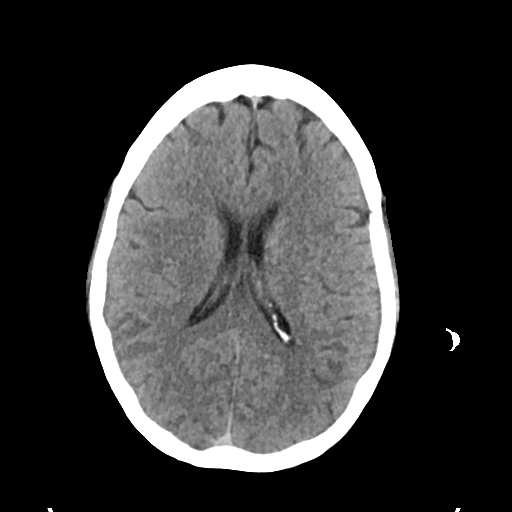
[im 21/33  brain]
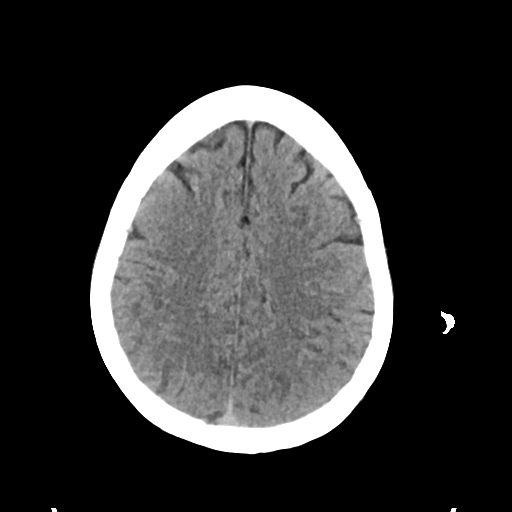
[im 21/33  bone]
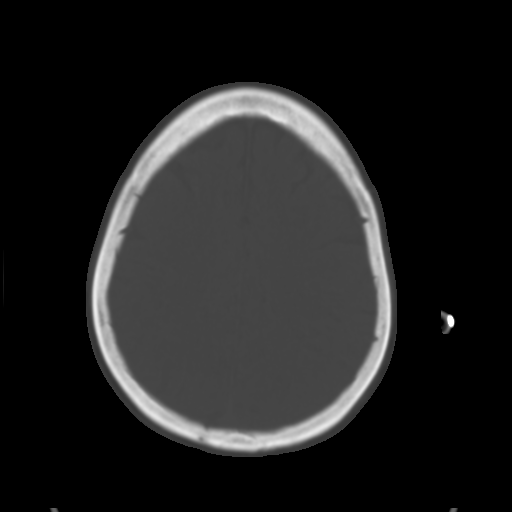
[im 25/33  brain]
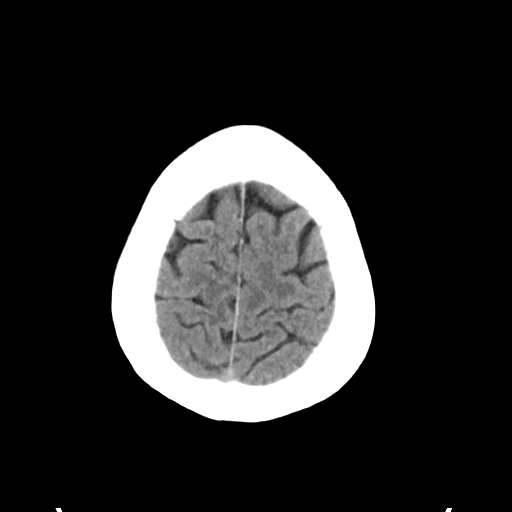
[im 29/33  brain]
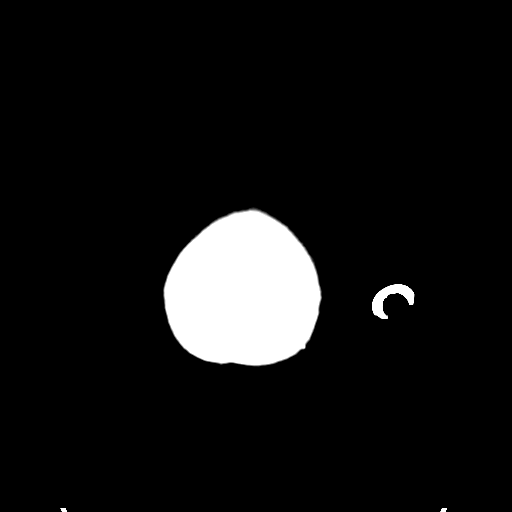

[Series 4: head bone · axial · 0.45mm/px · 1 of 81 slices shown]
[im 9/81  bone]
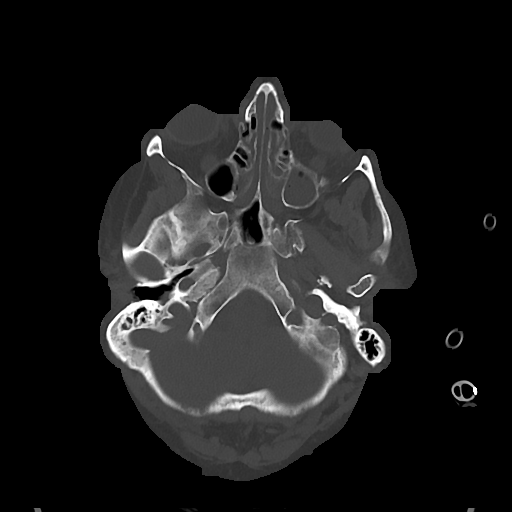

[Series 5: cor soft · coronal · 0.31mm/px · 3 of 69 slices shown]
[im 23/69  brain]
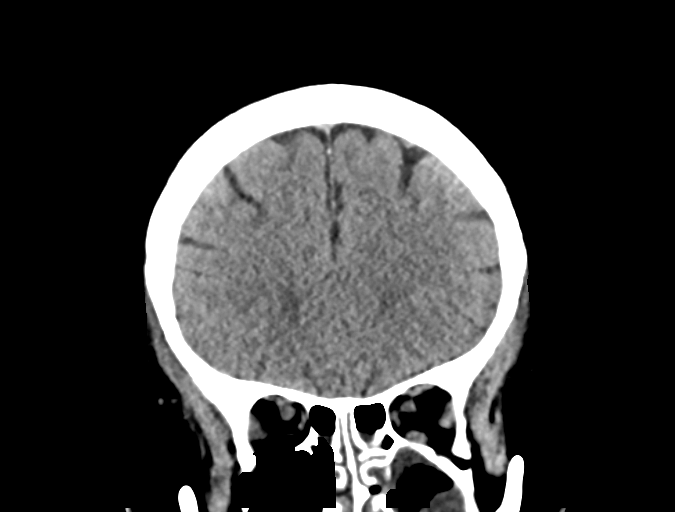
[im 31/69  brain]
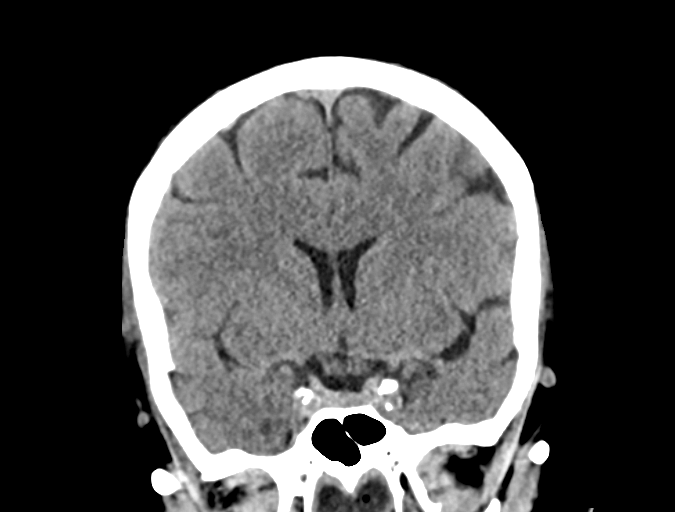
[im 38/69  brain]
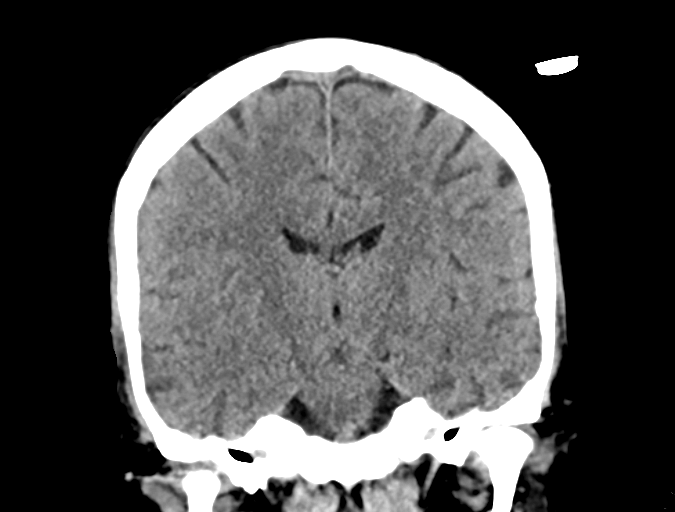

[Series 6: sag soft · sagittal · 0.31mm/px · 3 of 64 slices shown]
[im 22/64  brain]
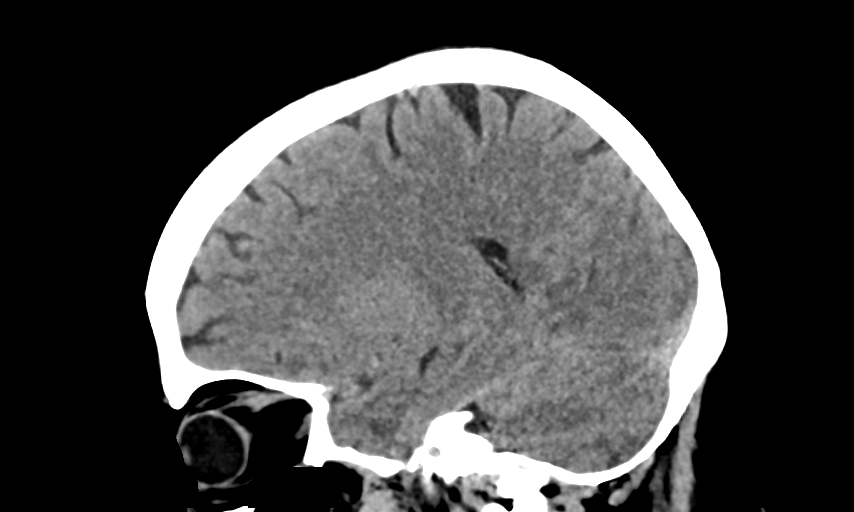
[im 32/64  brain]
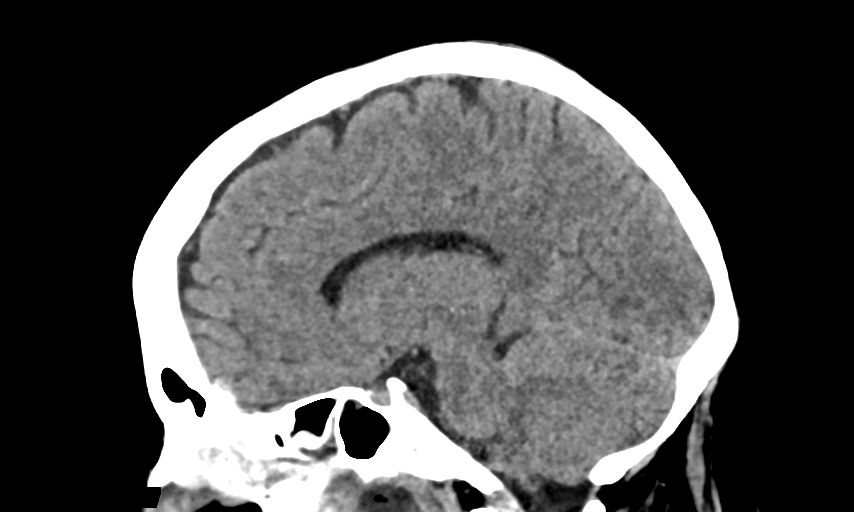
[im 43/64  brain]
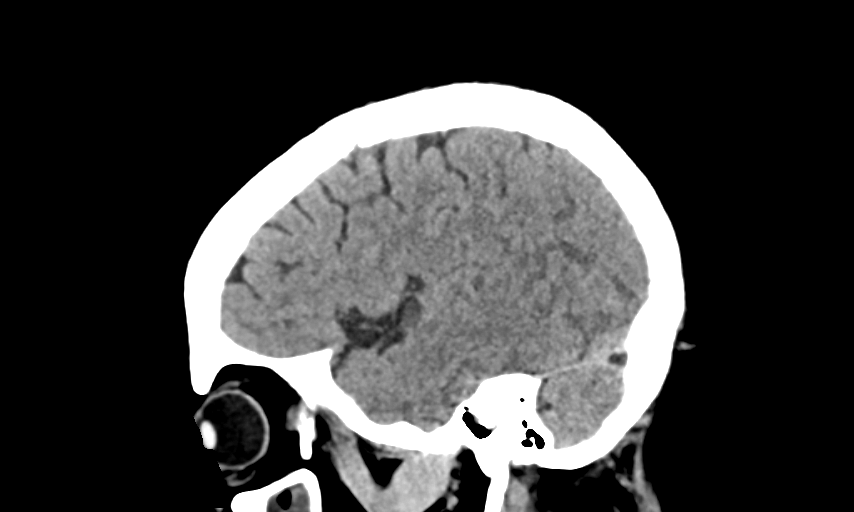

[14 of 47 positions shown; findings below may reference images not displayed]

FINDINGS: Brain: Ventricles are normal in size and configuration. All areas of
the brain demonstrate normal gray-white matter differentiation. No
mass, hemorrhage, edema or other evidence of acute parenchymal
abnormality. No extra-axial hemorrhage.

Vascular: There are chronic calcified atherosclerotic changes of the
large vessels at the skull base. No unexpected hyperdense vessel.

Skull: Normal. Negative for fracture or focal lesion.

Sinuses/Orbits: Fairly extensive mucosal thickening throughout the
ethmoid air cells and left maxillary sinus, of uncertain chronicity.
Periorbital and retro-orbital soft tissues are unremarkable.

Other: None.
IMPRESSION: 1. No acute intracranial abnormality. No intracranial mass,
hemorrhage or edema.
2. Paranasal sinus disease, of uncertain chronicity.

## 2018-09-19 IMAGING — DX DG CHEST 1V PORT
1 series · 1 of 1 positions shown · non-contrast
Comparison: September 04, 2017

CLINICAL DATA: Hypoxia

EXAM:
PORTABLE CHEST 1 VIEW

[chest ap]
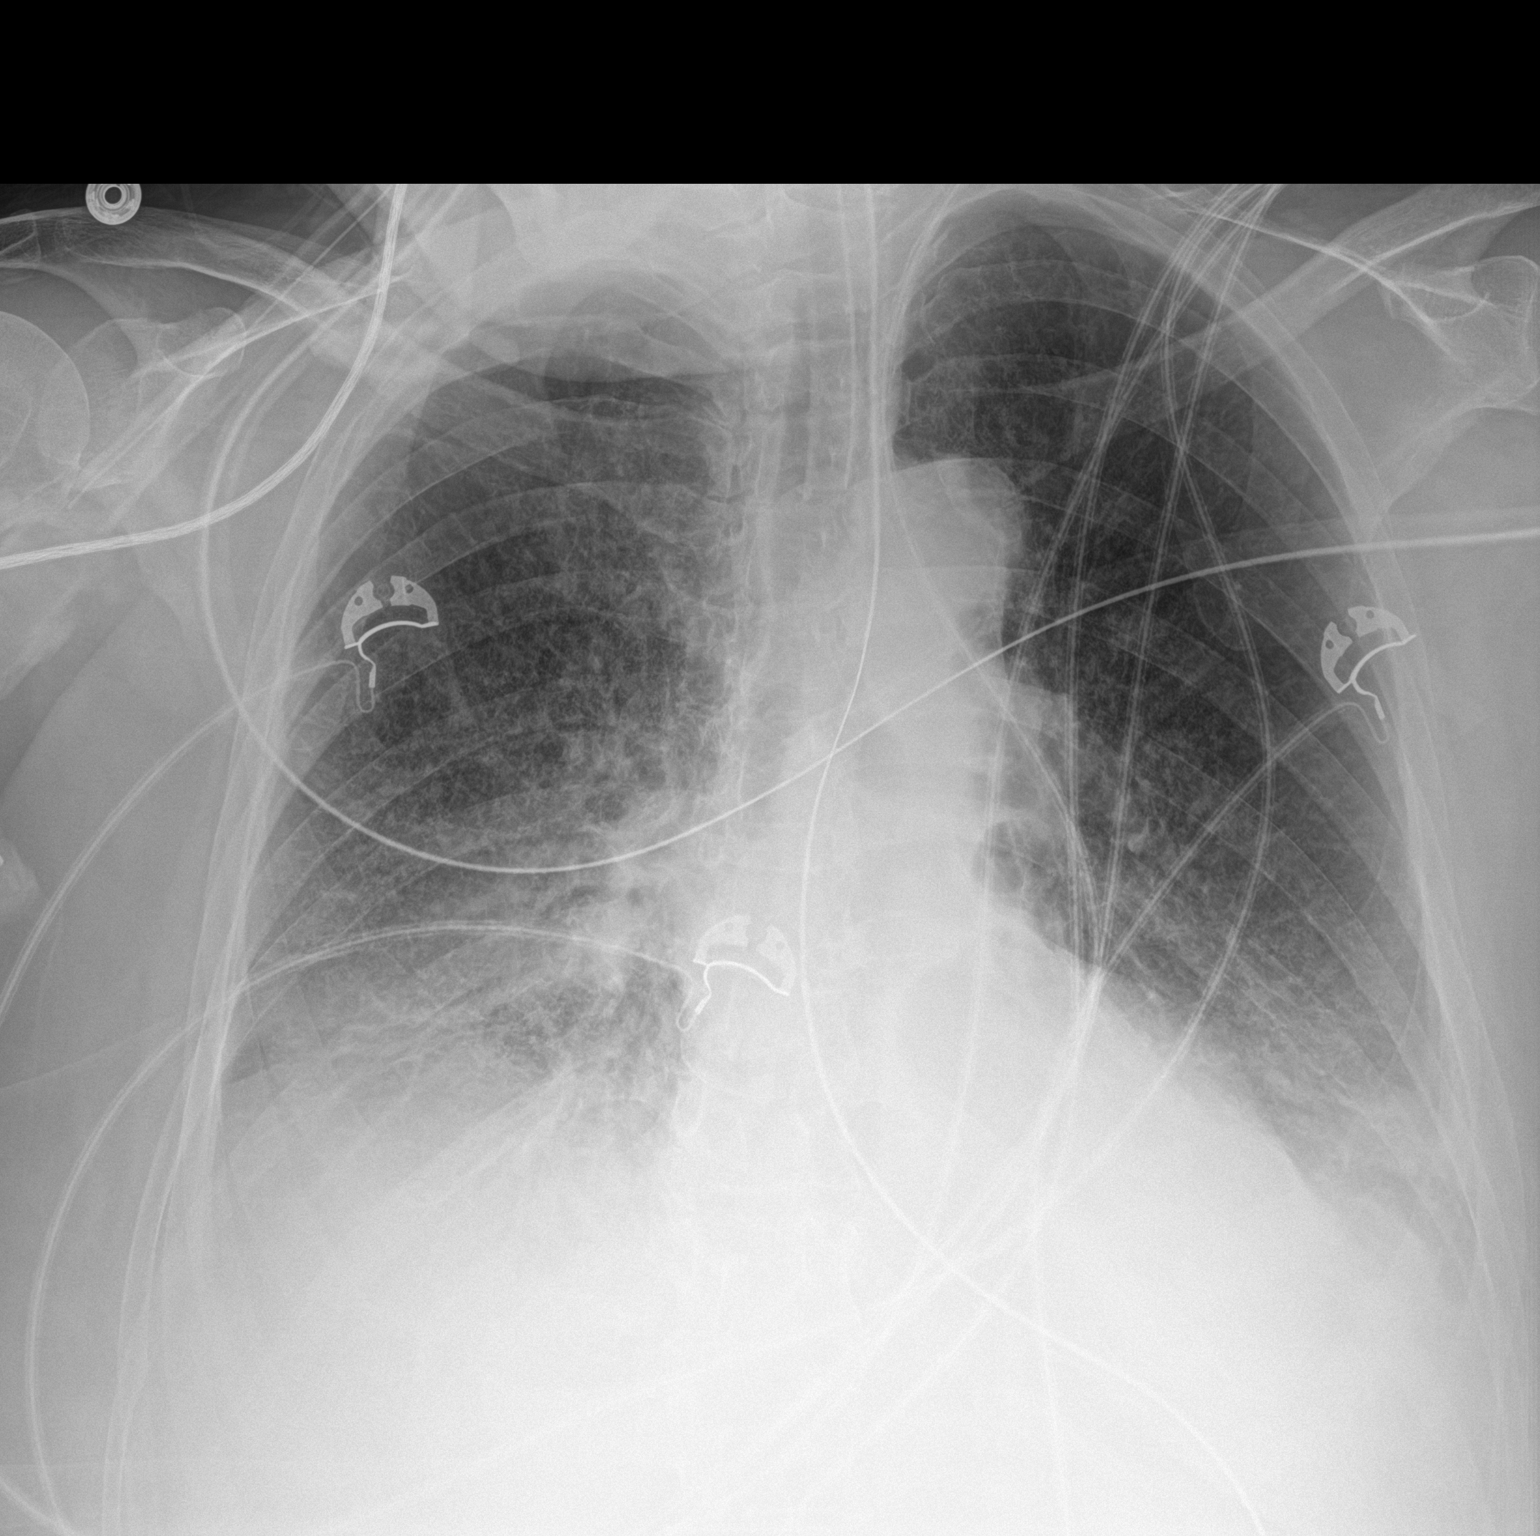

[1 of 1 positions shown; findings below may reference images not displayed]

FINDINGS: Endotracheal tube tip is 4.6 cm above the carina. Nasogastric tube
tip and side port are below the diaphragm. No pneumothorax. There is
underlying fibrotic change. There has been clearing of apparent
edema from the upper lobes compared to 1 day prior. Currently there
is atelectatic change in the bases with small pleural effusions.
Heart is upper normal in size with pulmonary vascularity within
normal limits. No adenopathy. No evident bone lesions.
IMPRESSION: Tube positions as described without pneumothorax. Apparent clearing
of interstitial edema from the upper lobes compared to 1 day prior.
There is a degree of underlying fibrotic type change. There are
small pleural effusions bilaterally with bibasilar atelectasis.
